# Patient Record
Sex: Male | Born: 1995 | Race: White | Hispanic: No | Marital: Single | Smoking: Never smoker
Health system: Southern US, Community
[De-identification: ages and names within clinical notes are randomized; demographics above are authoritative.]

## PROBLEM LIST (undated history)

## (undated) DIAGNOSIS — B2790 Infectious mononucleosis, unspecified without complication: Secondary | ICD-10-CM

## (undated) DIAGNOSIS — R768 Other specified abnormal immunological findings in serum: Secondary | ICD-10-CM

## (undated) HISTORY — DX: Other specified abnormal immunological findings in serum: R76.8

## (undated) HISTORY — DX: Infectious mononucleosis, unspecified without complication: B27.90

---
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
## 2017-07-12 DIAGNOSIS — K080 Exfoliation of teeth due to systemic causes: Secondary | ICD-10-CM | POA: Diagnosis not present

## 2017-12-04 ENCOUNTER — Encounter: Payer: Self-pay | Admitting: Internal Medicine

## 2018-01-26 DIAGNOSIS — J029 Acute pharyngitis, unspecified: Secondary | ICD-10-CM | POA: Diagnosis not present

## 2018-01-26 DIAGNOSIS — B2780 Other infectious mononucleosis without complication: Secondary | ICD-10-CM | POA: Diagnosis not present

## 2018-01-26 LAB — HEPATITIS C ANTIBODY: Hepatitis C Ab: POSITIVE

## 2018-01-26 LAB — BASIC METABOLIC PANEL
BUN: 9 (ref 4–21)
Creatinine: 0.9 (ref 0.6–1.3)
Glucose: 79
Potassium: 4.1 (ref 3.4–5.3)
Sodium: 138 (ref 137–147)

## 2018-01-26 LAB — CBC AND DIFFERENTIAL
HCT: 48 (ref 41–53)
Hemoglobin: 16.6 (ref 13.5–17.5)
Neutrophils Absolute: 1
Platelets: 186 (ref 150–399)
WBC: 9.5

## 2018-01-26 LAB — HEPATIC FUNCTION PANEL
ALT: 171 — AB (ref 10–40)
AST: 120 — AB (ref 14–40)
Alkaline Phosphatase: 112 (ref 25–125)
Bilirubin, Total: 0.7

## 2018-01-26 LAB — HEPATITIS B SURFACE ANTIGEN: Hepatitis B Surface Antigen: NEGATIVE

## 2018-01-26 LAB — MONONUCLEOSIS SCREEN: Mono Test: POSITIVE

## 2018-01-26 LAB — HEPATITIS A ANTIBODY, IGM: Hep A IgM: NEGATIVE

## 2018-01-26 LAB — HEPATITIS B CORE ANTIBODY, IGM: Hep B C IgM: NEGATIVE

## 2018-02-06 NOTE — Progress Notes (Signed)
Subjective:    Patient ID: Joshua Chen, male    DOB: 01-22-1996, 22 y.o.   MRN: [id number]  HPI:  Joshua Chen was dx'd with mononucleosis 01/26/18 at [facility] in [place]- he is a Consulting civil engineer at [employer]. He was also told his Hep C Virus Ab was + and his LFTs were markedly elevated. He denies tattoos, exposure to hypodermic needles. He has only had unprotected sexual contact with his current girlfriend (who is at [facility] and tested neg for Hep C) and his previous girlfriend. He asked "can my previous girlfriend been born with Hep C". We discussed that transmission rates from mother to baby are extremely low and the mother needed to have active Hep C at time of birth. He is unaware if his previous girlfriend's mother was ever Hep C + He was not given any medications at [facility] His only current sx's- swollen anterior cervical lymph nodes, fatigue, dull ache over RUQ He denies fever/night sweats/poor appetite/hematuria/hematochezia. He reports darker colored urine and has been increasing water intake. He denies ETOH or Acetaminophen use We dicussed the importance of staying well hydrated, eating a well balanced diet, getting adequate rest, avoiding contact sports, avoiding ETOH, and avoiding close contact with others, esp sexually contact. He denies hx of mononucleosis He is sophomore at [employer], currently studying applied math and plans to change major to computer science His girlfriend is at [facility] and he gave verbal permission for her to be present during OV.  Patient Care Team    Relationship Specialty Notifications Start End  Julaine Fusi, NP PCP - General Family Medicine  12/14/16     Patient Active Problem List   Diagnosis Date Noted  . Monospot test positive 02/07/2018  . Hepatitis C antibody test positive 02/07/2018  . Screening for HIV (human immunodeficiency virus) 02/07/2018  . Health care maintenance 12/14/2016     Past Medical History:  Diagnosis Date  . Hepatitis C  antibody positive in blood   . Mononucleosis      History reviewed. No pertinent surgical history.   Family History  Problem Relation Age of Onset  . Mental illness Mother   . Hypertension Father   . Mental illness Father   . Diabetes Father   . Hyperlipidemia Father   . Hypertension Maternal Grandfather   . Depression Maternal Grandfather   . Diabetes Maternal Grandfather   . Hyperlipidemia Maternal Grandfather   . Depression Paternal Uncle   . Suicidality Paternal Uncle   . Alcohol abuse Maternal Grandmother   . Diabetes Paternal Grandmother   . Hyperlipidemia Paternal Grandmother   . Diabetes Paternal Grandfather   . Hyperlipidemia Paternal Grandfather   . Hypertension Paternal Grandfather   . Depression Paternal Uncle   . Suicidality Paternal Uncle      Social History   Substance and Sexual Activity  Drug Use No     Social History   Substance and Sexual Activity  Alcohol Use No  . Alcohol/week: 0.0 oz     Social History   Tobacco Use  Smoking Status Never Smoker  Smokeless Tobacco Never Used     No outpatient encounter medications on file as of 02/07/2018.   No facility-administered encounter medications on file as of 02/07/2018.     Allergies: Patient has no known allergies.  Body mass index is 26.25 kg/m.  Blood pressure 118/73, pulse 84, height  (1.803 m), weight 188 lb 3.2 oz (85.4 kg), SpO2 98 %.  Review of Systems  Constitutional: Positive for fatigue. Negative for activity change, appetite change, chills, diaphoresis, fever and unexpected weight change.  Eyes: Negative for visual disturbance.  Respiratory: Negative for cough, chest tightness, shortness of breath, wheezing and stridor.   Cardiovascular: Negative for chest pain, palpitations and leg swelling.  Gastrointestinal: Positive for abdominal pain. Negative for abdominal distention, blood in stool, constipation, diarrhea, nausea and vomiting.  Endocrine: Negative for cold  intolerance, heat intolerance, polydipsia, polyphagia and polyuria.  Genitourinary: Negative for decreased urine volume, difficulty urinating, flank pain, frequency, hematuria, penile swelling and urgency.  Musculoskeletal: Negative for arthralgias, back pain, gait problem, joint swelling, myalgias, neck pain and neck stiffness.  Skin: Negative for color change, pallor, rash and wound.  Neurological: Negative for dizziness and headaches.  Hematological: Does not bruise/bleed easily.  Psychiatric/Behavioral: Negative for decreased concentration, dysphoric mood, hallucinations, self-injury, sleep disturbance and suicidal ideas. The patient is not nervous/anxious and is not hyperactive.        Objective:   Physical Exam  Constitutional: He appears well-developed and well-nourished. No distress.  HENT:  Head: Normocephalic and atraumatic.  Right Ear: External ear normal.  Left Ear: External ear normal.  Eyes: Pupils are equal, round, and reactive to light. Conjunctivae and EOM are normal.  Neck: Normal range of motion. Neck supple. No neck rigidity.    Bil anterior cervical lymphadenopathy  Cardiovascular: Normal rate, regular rhythm, normal heart sounds and intact distal pulses.  No murmur heard. Pulmonary/Chest: Effort normal and breath sounds normal. No stridor. No respiratory distress. He has no wheezes. He has no rales. He exhibits no tenderness.  Abdominal: Soft. Bowel sounds are normal. He exhibits no distension and no mass. There is hepatosplenomegaly. There is tenderness in the right upper quadrant. There is no rigidity, no rebound, no guarding, no CVA tenderness, no tenderness at McBurney's point and negative Murphy's sign. No hernia.  Lymphadenopathy:    He has cervical adenopathy.  Skin: Skin is warm and dry. Capillary refill takes less than 2 seconds. No rash noted. He is not diaphoretic. No erythema. No pallor.  Psychiatric: He has a normal mood and affect. His behavior is  normal. Judgment and thought content normal.      Assessment & Plan:   1. Screening for HIV (human immunodeficiency virus)   2. Hepatitis C antibody test positive   3. Monospot test positive     Screening for HIV (human immunodeficiency virus) We will cal when lab results are available  Monospot test positive Please follow Mononucleosis care instructions. We will call you when lab results are available. Urgent referral to Gastroenterologist. Recommend completed physical in 3 months. Please call with any questions/concerns.  Hepatitis C antibody test positive Urgent referral to Gastroenterologist.   FOLLOW-UP:  Return in about 3 months (around 05/10/2018) for CPE.

## 2018-02-07 ENCOUNTER — Encounter: Payer: Self-pay | Admitting: Adult Health

## 2018-02-07 ENCOUNTER — Ambulatory Visit (INDEPENDENT_AMBULATORY_CARE_PROVIDER_SITE_OTHER): Payer: Federal, State, Local not specified - PPO | Admitting: Adult Health

## 2018-02-07 VITALS — BP 118/73 | HR 84 | Ht 71.0 in | Wt 188.2 lb

## 2018-02-07 DIAGNOSIS — R768 Other specified abnormal immunological findings in serum: Secondary | ICD-10-CM | POA: Insufficient documentation

## 2018-02-07 DIAGNOSIS — Z114 Encounter for screening for human immunodeficiency virus [HIV]: Secondary | ICD-10-CM | POA: Insufficient documentation

## 2018-02-07 DIAGNOSIS — B2790 Infectious mononucleosis, unspecified without complication: Secondary | ICD-10-CM | POA: Diagnosis not present

## 2018-02-07 NOTE — Assessment & Plan Note (Signed)
Please follow Mononucleosis care instructions. We will call you when lab results are available. Urgent referral to Gastroenterologist. Recommend completed physical in 3 months. Please call with any questions/concerns.

## 2018-02-07 NOTE — Patient Instructions (Signed)
Infectious Mononucleosis Infectious mononucleosis is an infection that is caused by a virus. This illness is often called "mono." You can get mono from close contact with someone who is infected (it is contagious). If you have mono, you may feel tired and have a sore throat, a headache, or a fever. Mono is usually not serious, but some people may need to be treated for it in the hospital. Follow these instructions at home: Medicines  Take over-the-counter and prescription medicines only as told by your doctor.  Do not take ampicillin or amoxicillin. This may cause a rash.  If you are under 18, do not take aspirin. Activity  Rest as needed.  Do not do any of the following activities until your doctor says that they are safe for you: ? Contact sports. You may need to wait a month or longer before you play sports. ? Exercise that requires a lot of energy. ? Lifting heavy things.  Slowly go back to your normal activities after your fever is gone, or when your doctor says that you can. Be sure to rest when you get tired. Preventing infectious mononucleosis  Avoid contact with people who have mono. An infected person may not seem sick, but he or she can still spread the virus.  Avoid sharing forks, spoons, knives (utensils), drinking cups, or toothbrushes.  Wash your hands often with soap and water. If you cannot use soap and water, use hand sanitizer.  Use the inside of your elbow to cover your mouth when you cough or sneeze. General instructions  Avoid kissing or sharing forks, spoons, knives, or drinking cups until your doctor approves.  Drink enough fluid to keep your pee (urine) clear or pale yellow.  Do not drink alcohol.  If you have a sore throat: ? Rinse your mouth (gargle) with a salt-water mixture 3-4 times a day or as needed. To make a salt-water mixture, completely dissolve -1 tsp of salt in 1 cup of warm water. ? Eat soft foods. Cold foods such as ice cream or frozen  ice pops can help your throat feel better. ? Try sucking on hard candy.  Wash your hands often with soap and water. If you cannot use soap and water, use hand sanitizer. Contact a doctor if:  Your fever is not gone after 10 days.  You have swelling by your jaw or neck (swollen lymph nodes), and the swelling does not go away after 4 weeks.  Your activity level is not back to normal after 2 months.  Your skin or the white parts of your eyes turn yellow (jaundice).  You have trouble pooping (have constipation). This may mean that you: ? Poop (have a bowel movement) fewer times in a week than normal. ? Have a hard time pooping. ? Have poop that is dry, hard, or bigger than normal. Get help right away if:  You have very bad pain in your: ? Belly (abdomen). ? Shoulder.  You are drooling.  You have trouble swallowing.  You have trouble breathing.  You have a stiff neck.  You have a very bad headache.  You cannot stop throwing up (vomiting).  You have jerky movements that you cannot control (seizures).  You are confused.  You have trouble with balance.  Your nose or gums start to bleed.  You have signs of body fluid loss (dehydration). These may include: ? Weakness. ? Sunken eyes. ? Pale skin. ? Dry mouth. ? Fast breathing or heartbeat. Summary  Infectious mononucleosis, or "  mono," is an infection that is caused by a virus.  Mono is usually not serious, but some people may need to be treated for it in the hospital.  You should not play contact sports or lift heavy things until your doctor says that you can.  Wash your hands often with soap and water. If you cannot use soap and water, use hand sanitizer. This information is not intended to replace advice given to you by your health care provider. Make sure you discuss any questions you have with your health care provider. Document Released: 09/13/2009 Document Revised: 06/13/2016 Document Reviewed:  06/13/2016 Elsevier Interactive Patient Education  2017 [employer].  Please follow above care instructions. We will call you when lab results are available. Urgent referral to Gastroenterologist. Recommend completed physical in 3 months. Please call with any questions/concerns. FEEL BETTER!

## 2018-02-07 NOTE — Assessment & Plan Note (Signed)
We will cal when lab results are available

## 2018-02-07 NOTE — Assessment & Plan Note (Signed)
Urgent referral to Gastroenterologist.

## 2018-02-08 LAB — HIV ANTIBODY (ROUTINE TESTING W REFLEX): HIV Screen 4th Generation wRfx: NONREACTIVE

## 2018-02-12 ENCOUNTER — Telehealth: Payer: Self-pay

## 2018-02-12 NOTE — Telephone Encounter (Signed)
[name] called stating that [facility] informed her that they do not see pt's for Hepatitis C and that he should be referred to [facility].  Fonda said they could only see him for the elevated LFTs, but mom is assuming that this will resolve itself d/t recent mono diagnosis.  Please advise how you would like to proceed.  Tiajuana Amass, CMA

## 2018-02-19 ENCOUNTER — Other Ambulatory Visit: Payer: Self-pay | Admitting: Adult Health

## 2018-02-19 NOTE — Telephone Encounter (Signed)
LVM for pt's mother to call to discuss.  Tiajuana Amass, CMA

## 2018-02-19 NOTE — Telephone Encounter (Signed)
[name] informed of referral.  Advised pt's mother to call [name] in our office if she has not heard anything within 1 week.  Tiajuana Amass, CMA

## 2018-02-19 NOTE — Telephone Encounter (Signed)
Good Afternoon Team, Joshua Chen can you please call Joshua Chen and share: I am referring him to  [facility] [name] [phone] Per [facility] recommendations. If he or his mother has questions regarding mono sequelae, ie hepatitis, they should address them to the specialists.  [name]- can you please complete the referral, they are not listed in Epic  Thanks! Art Buff

## 2018-02-19 NOTE — Progress Notes (Signed)
Per [facility]- they will only treat LFTs and not address + Hep C. Advised to refer pt to [facility]- Ma Hillock [phone]

## 2018-02-27 DIAGNOSIS — B182 Chronic viral hepatitis C: Secondary | ICD-10-CM | POA: Diagnosis not present

## 2018-02-27 DIAGNOSIS — R768 Other specified abnormal immunological findings in serum: Secondary | ICD-10-CM | POA: Diagnosis not present

## 2018-02-27 LAB — HEPATIC FUNCTION PANEL
ALK PHOS: 70 (ref 25–125)
ALT: 36 (ref 10–40)
AST: 21 (ref 14–40)
BILIRUBIN, TOTAL: 1.9

## 2018-02-27 LAB — IRON,TIBC AND FERRITIN PANEL
Ferritin: 126
Iron: 123
TIBC: 326

## 2018-02-27 LAB — CBC AND DIFFERENTIAL
HCT: 45 (ref 41–53)
Hemoglobin: 15.7 (ref 13.5–17.5)
Platelets: 261 (ref 150–399)
WBC: 5.6

## 2018-02-27 LAB — PROTIME-INR: Protime: 11.2 (ref 10.0–13.8)

## 2018-02-27 LAB — HEPATITIS C ANTIBODY: Hepatitis C Ab: POSITIVE

## 2018-02-27 LAB — BASIC METABOLIC PANEL
CREATININE: 1 (ref <=1.3)
Sodium: 142 (ref 137–147)

## 2018-02-27 LAB — HEPATITIS B CORE ANTIBODY, TOTAL: Hep B S Ab: NEGATIVE

## 2018-03-01 DIAGNOSIS — K080 Exfoliation of teeth due to systemic causes: Secondary | ICD-10-CM | POA: Diagnosis not present

## 2018-04-12 ENCOUNTER — Encounter: Payer: Self-pay | Admitting: Adult Health

## 2018-04-18 ENCOUNTER — Encounter: Payer: Self-pay | Admitting: Adult Health

## 2018-04-18 ENCOUNTER — Ambulatory Visit (INDEPENDENT_AMBULATORY_CARE_PROVIDER_SITE_OTHER): Payer: Federal, State, Local not specified - PPO | Admitting: Adult Health

## 2018-04-18 ENCOUNTER — Other Ambulatory Visit: Payer: Self-pay

## 2018-04-18 VITALS — BP 117/74 | HR 65 | Ht 71.0 in | Wt 188.8 lb

## 2018-04-18 DIAGNOSIS — Z0000 Encounter for general adult medical examination without abnormal findings: Secondary | ICD-10-CM

## 2018-04-18 DIAGNOSIS — R768 Other specified abnormal immunological findings in serum: Secondary | ICD-10-CM

## 2018-04-18 DIAGNOSIS — R945 Abnormal results of liver function studies: Secondary | ICD-10-CM

## 2018-04-18 DIAGNOSIS — R7989 Other specified abnormal findings of blood chemistry: Secondary | ICD-10-CM

## 2018-04-18 NOTE — Assessment & Plan Note (Signed)
01/26/18- ALT 171, AST 120  CMP drawn today

## 2018-04-18 NOTE — Assessment & Plan Note (Signed)
Labs from [facility]- Liver function improved and no active HCV infection. Follow-up with Gastroenterologist in August 2019.

## 2018-04-18 NOTE — Addendum Note (Signed)
Addended by: Stan HeadNELSON, TONYA S on: 04/18/2018 01:51 PM   Modules accepted: Orders

## 2018-04-18 NOTE — Patient Instructions (Signed)
Preventive Care for Adults, Male A healthy lifestyle and preventive care can promote health and wellness. Preventive health guidelines for men include the following key practices:  A routine yearly physical is a good way to check with your health care provider about your health and preventative screening. It is a chance to share any concerns and updates on your health and to receive a thorough exam.  Visit your dentist for a routine exam and preventative care every 6 months. Brush your teeth twice a day and floss once a day. Good oral hygiene prevents tooth decay and gum disease.  The frequency of eye exams is based on your age, health, family medical history, use of contact lenses, and other factors. Follow your health care provider's recommendations for frequency of eye exams.  Eat a healthy diet. Foods such as vegetables, fruits, whole grains, low-fat dairy products, and lean protein foods contain the nutrients you need without too many calories. Decrease your intake of foods high in solid fats, added sugars, and salt. Eat the right amount of calories for you.Get information about a proper diet from your health care provider, if necessary.  Regular physical exercise is one of the most important things you can do for your health. Most adults should get at least 150 minutes of moderate-intensity exercise (any activity that increases your heart rate and causes you to sweat) each week. In addition, most adults need muscle-strengthening exercises on 2 or more days a week.  Maintain a healthy weight. The body mass index (BMI) is a screening tool to identify possible weight problems. It provides an estimate of body fat based on height and weight. Your health care provider can find your BMI and can help you achieve or maintain a healthy weight.For adults 20 years and older:  A BMI below 18.5 is considered underweight.  A BMI of 18.5 to 24.9 is normal.  A BMI of 25 to 29.9 is considered  overweight.  A BMI of 30 and above is considered obese.  Maintain normal blood lipids and cholesterol levels by exercising and minimizing your intake of saturated fat. Eat a balanced diet with plenty of fruit and vegetables. Blood tests for lipids and cholesterol should begin at age 55 and be repeated every 5 years. If your lipid or cholesterol levels are high, you are over 50, or you are at high risk for heart disease, you may need your cholesterol levels checked more frequently.Ongoing high lipid and cholesterol levels should be treated with medicines if diet and exercise are not working.  If you smoke, find out from your health care provider how to quit. If you do not use tobacco, do not start.  Lung cancer screening is recommended for adults aged 90-80 years who are at high risk for developing lung cancer because of a history of smoking. A yearly low-dose CT scan of the lungs is recommended for people who have at least a 30-pack-year history of smoking and are a current smoker or have quit within the past 15 years. A pack year of smoking is smoking an average of 1 pack of cigarettes a day for 1 year (for example: 1 pack a day for 30 years or 2 packs a day for 15 years). Yearly screening should continue until the smoker has stopped smoking for at least 15 years. Yearly screening should be stopped for people who develop a health problem that would prevent them from having lung cancer treatment.  If you choose to drink alcohol, do not have more  than 2 drinks per day. One drink is considered to be 12 ounces (355 mL) of beer, 5 ounces (148 mL) of wine, or 1.5 ounces (44 mL) of liquor.  Avoid use of street drugs. Do not share needles with anyone. Ask for help if you need support or instructions about stopping the use of drugs.  High blood pressure causes heart disease and increases the risk of stroke. Your blood pressure should be checked at least every 1-2 years. Ongoing high blood pressure should be  treated with medicines, if weight loss and exercise are not effective.  If you are 34-90 years old, ask your health care provider if you should take aspirin to prevent heart disease.  Diabetes screening is done by taking a blood sample to check your blood glucose level after you have not eaten for a certain period of time (fasting). If you are not overweight and you do not have risk factors for diabetes, you should be screened once every 3 years starting at age 35. If you are overweight or obese and you are 70-84 years of age, you should be screened for diabetes every year as part of your cardiovascular risk assessment.  Colorectal cancer can be detected and often prevented. Most routine colorectal cancer screening begins at the age of 18 and continues through age 69. However, your health care provider may recommend screening at an earlier age if you have risk factors for colon cancer. On a yearly basis, your health care provider may provide home test kits to check for hidden blood in the stool. Use of a small camera at the end of a tube to directly examine the colon (sigmoidoscopy or colonoscopy) can detect the earliest forms of colorectal cancer. Talk to your health care provider about this at age 71, when routine screening begins. Direct exam of the colon should be repeated every 5-10 years through age 18, unless early forms of precancerous polyps or small growths are found.  People who are at an increased risk for hepatitis B should be screened for this virus. You are considered at high risk for hepatitis B if:  You were born in a country where hepatitis B occurs often. Talk with your health care provider about which countries are considered high risk.  Your parents were born in a high-risk country and you have not received a shot to protect against hepatitis B (hepatitis B vaccine).  You have HIV or AIDS.  You use needles to inject street drugs.  You live with, or have sex with, someone who  has hepatitis B.  You are a man who has sex with other men (MSM).  You get hemodialysis treatment.  You take certain medicines for conditions such as cancer, organ transplantation, and autoimmune conditions.  Hepatitis C blood testing is recommended for all people born from [birth year] through 1965 and any individual with known risks for hepatitis C.  Practice safe sex. Use condoms and avoid high-risk sexual practices to reduce the spread of sexually transmitted infections (STIs). STIs include gonorrhea, chlamydia, syphilis, trichomonas, herpes, HPV, and human immunodeficiency virus (HIV). Herpes, HIV, and HPV are viral illnesses that have no cure. They can result in disability, cancer, and death.  If you are a man who has sex with other men, you should be screened at least once per year for:  HIV.  Urethral, rectal, and pharyngeal infection of gonorrhea, chlamydia, or both.  If you are at risk of being infected with HIV, it is recommended that you take a  prescription medicine daily to prevent HIV infection. This is called preexposure prophylaxis (PrEP). You are considered at risk if:  You are a man who has sex with other men (MSM) and have other risk factors.  You are a heterosexual man, are sexually active, and are at increased risk for HIV infection.  You take drugs by injection.  You are sexually active with a partner who has HIV.  Talk with your health care provider about whether you are at high risk of being infected with HIV. If you choose to begin PrEP, you should first be tested for HIV. You should then be tested every 3 months for as long as you are taking PrEP.  A one-time screening for abdominal aortic aneurysm (AAA) and surgical repair of large AAAs by ultrasound are recommended for men ages 44 to 66 years who are current or former smokers.  Healthy men should no longer receive prostate-specific antigen (PSA) blood tests as part of routine cancer screening. Talk with your health  care provider about prostate cancer screening.  Testicular cancer screening is not recommended for adult males who have no symptoms. Screening includes self-exam, a health care provider exam, and other screening tests. Consult with your health care provider about any symptoms you have or any concerns you have about testicular cancer.  Use sunscreen. Apply sunscreen liberally and repeatedly throughout the day. You should seek shade when your shadow is shorter than you. Protect yourself by wearing long sleeves, pants, a wide-brimmed hat, and sunglasses year round, whenever you are outdoors.  Once a month, do a whole-body skin exam, using a mirror to look at the skin on your back. Tell your health care provider about new moles, moles that have irregular borders, moles that are larger than a pencil eraser, or moles that have changed in shape or color.  Stay current with required vaccines (immunizations).  Influenza vaccine. All adults should be immunized every year.  Tetanus, diphtheria, and acellular pertussis (Td, Tdap) vaccine. An adult who has not previously received Tdap or who does not know his vaccine status should receive 1 dose of Tdap. This initial dose should be followed by tetanus and diphtheria toxoids (Td) booster doses every 10 years. Adults with an unknown or incomplete history of completing a 3-dose immunization series with Td-containing vaccines should begin or complete a primary immunization series including a Tdap dose. Adults should receive a Td booster every 10 years.  Varicella vaccine. An adult without evidence of immunity to varicella should receive 2 doses or a second dose if he has previously received 1 dose.  Human papillomavirus (HPV) vaccine. Males aged 11-21 years who have not received the vaccine previously should receive the 3-dose series. Males aged 22-26 years may be immunized. Immunization is recommended through the age of 23 years for any male who has sex with males  and did not get any or all doses earlier. Immunization is recommended for any person with an immunocompromised condition through the age of 72 years if he did not get any or all doses earlier. During the 3-dose series, the second dose should be obtained 4-8 weeks after the first dose. The third dose should be obtained 24 weeks after the first dose and 16 weeks after the second dose.  Zoster vaccine. One dose is recommended for adults aged 23 years or older unless certain conditions are present.  Measles, mumps, and rubella (MMR) vaccine. Adults born before [birth year] generally are considered immune to measles and mumps. Adults born in [birth year]  or later should have 1 or more doses of MMR vaccine unless there is a contraindication to the vaccine or there is laboratory evidence of immunity to each of the three diseases. A routine second dose of MMR vaccine should be obtained at least 28 days after the first dose for students attending postsecondary schools, health care workers, or international travelers. People who received inactivated measles vaccine or an unknown type of measles vaccine during 1963-1967 should receive 2 doses of MMR vaccine. People who received inactivated mumps vaccine or an unknown type of mumps vaccine before 1979 and are at high risk for mumps infection should consider immunization with 2 doses of MMR vaccine. Unvaccinated health care workers born before [birth year] who lack laboratory evidence of measles, mumps, or rubella immunity or laboratory confirmation of disease should consider measles and mumps immunization with 2 doses of MMR vaccine or rubella immunization with 1 dose of MMR vaccine.  Pneumococcal 13-valent conjugate (PCV13) vaccine. When indicated, a person who is uncertain of his immunization history and has no record of immunization should receive the PCV13 vaccine. All adults 9 years of age and older should receive this vaccine. An adult aged 69 years or older who has certain medical  conditions and has not been previously immunized should receive 1 dose of PCV13 vaccine. This PCV13 should be followed with a dose of pneumococcal polysaccharide (PPSV23) vaccine. Adults who are at high risk for pneumococcal disease should obtain the PPSV23 vaccine at least 8 weeks after the dose of PCV13 vaccine. Adults older than 20 years of age who have normal immune system function should obtain the PPSV23 vaccine dose at least 1 year after the dose of PCV13 vaccine.  Pneumococcal polysaccharide (PPSV23) vaccine. When PCV13 is also indicated, PCV13 should be obtained first. All adults aged 79 years and older should be immunized. An adult younger than age 43 years who has certain medical conditions should be immunized. Any person who resides in a nursing home or long-term care facility should be immunized. An adult smoker should be immunized. People with an immunocompromised condition and certain other conditions should receive both PCV13 and PPSV23 vaccines. People with human immunodeficiency virus (HIV) infection should be immunized as soon as possible after diagnosis. Immunization during chemotherapy or radiation therapy should be avoided. Routine use of PPSV23 vaccine is not recommended for American Indians, [place] Natives, or people younger than 65 years unless there are medical conditions that require PPSV23 vaccine. When indicated, people who have unknown immunization and have no record of immunization should receive PPSV23 vaccine. One-time revaccination 5 years after the first dose of PPSV23 is recommended for people aged 19-64 years who have chronic kidney failure, nephrotic syndrome, asplenia, or immunocompromised conditions. People who received 1-2 doses of PPSV23 before age 70 years should receive another dose of PPSV23 vaccine at age 79 years or later if at least 5 years have passed since the previous dose. Doses of PPSV23 are not needed for people immunized with PPSV23 at or after age 55  years.  Meningococcal vaccine. Adults with asplenia or persistent complement component deficiencies should receive 2 doses of quadrivalent meningococcal conjugate (MenACWY-D) vaccine. The doses should be obtained at least 2 months apart. Microbiologists working with certain meningococcal bacteria, [employer] recruits, people at risk during an outbreak, and people who travel to or live in countries with a high rate of meningitis should be immunized. A first-year college student up through age 64 years who is living in a residence hall should receive a  dose if he did not receive a dose on or after his 16th birthday. Adults who have certain high-risk conditions should receive one or more doses of vaccine.  Hepatitis A vaccine. Adults who wish to be protected from this disease, have chronic liver disease, work with hepatitis A-infected animals, work in hepatitis A research labs, or travel to or work in countries with a high rate of hepatitis A should be immunized. Adults who were previously unvaccinated and who anticipate close contact with an international adoptee during the first 60 days after arrival in the [place] from a country with a high rate of hepatitis A should be immunized.  Hepatitis B vaccine. Adults should be immunized if they wish to be protected from this disease, are under age 34 years and have diabetes, have chronic liver disease, have had more than one sex partner in the past 6 months, may be exposed to blood or other infectious body fluids, are household contacts or sex partners of hepatitis B positive people, are clients or workers in certain care facilities, or travel to or work in countries with a high rate of hepatitis B.  Haemophilus influenzae type b (Hib) vaccine. A previously unvaccinated person with asplenia or sickle cell disease or having a scheduled splenectomy should receive 1 dose of Hib vaccine. Regardless of previous immunization, a recipient of a hematopoietic stem cell  transplant should receive a 3-dose series 6-12 months after his successful transplant. Hib vaccine is not recommended for adults with HIV infection. Preventive Service / Frequency Ages 77 to 55  Blood pressure check.** / Every 3-5 years.  Lipid and cholesterol check.** / Every 5 years beginning at age 66.  Hepatitis C blood test.** / For any individual with known risks for hepatitis C.  Skin self-exam. / Monthly.  Influenza vaccine. / Every year.  Tetanus, diphtheria, and acellular pertussis (Tdap, Td) vaccine.** / Consult your health care provider. 1 dose of Td every 10 years.  Varicella vaccine.** / Consult your health care provider.  HPV vaccine. / 3 doses over 6 months, if 45 or younger.  Measles, mumps, rubella (MMR) vaccine.** / You need at least 1 dose of MMR if you were born in 1957 or later. You may also need a second dose.  Pneumococcal 13-valent conjugate (PCV13) vaccine.** / Consult your health care provider.  Pneumococcal polysaccharide (PPSV23) vaccine.** / 1 to 2 doses if you smoke cigarettes or if you have certain conditions.  Meningococcal vaccine.** / 1 dose if you are age 81 to 79 years and a Market researcher living in a residence hall, or have one of several medical conditions. You may also need additional booster doses.  Hepatitis A vaccine.** / Consult your health care provider.  Hepatitis B vaccine.** / Consult your health care provider.  Haemophilus influenzae type b (Hib) vaccine.** / Consult your health care provider. Ages 6 to 58  Blood pressure check.** / Every year.  Lipid and cholesterol check.** / Every 5 years beginning at age 89.  Lung cancer screening. / Every year if you are aged 84-80 years and have a 30-pack-year history of smoking and currently smoke or have quit within the past 15 years. Yearly screening is stopped once you have quit smoking for at least 15 years or develop a health problem that would prevent you from having  lung cancer treatment.  Fecal occult blood test (FOBT) of stool. / Every year beginning at age 90 and continuing until age 73. You may not have to do  this test if you get a colonoscopy every 10 years.  Flexible sigmoidoscopy** or colonoscopy.** / Every 5 years for a flexible sigmoidoscopy or every 10 years for a colonoscopy beginning at age 50 and continuing until age 75.  Hepatitis C blood test.** / For all people born from 1945 through 1965 and any individual with known risks for hepatitis C.  Skin self-exam. / Monthly.  Influenza vaccine. / Every year.  Tetanus, diphtheria, and acellular pertussis (Tdap/Td) vaccine.** / Consult your health care provider. 1 dose of Td every 10 years.  Varicella vaccine.** / Consult your health care provider.  Zoster vaccine.** / 1 dose for adults aged 60 years or older.  Measles, mumps, rubella (MMR) vaccine.** / You need at least 1 dose of MMR if you were born in 1957 or later. You may also need a second dose.  Pneumococcal 13-valent conjugate (PCV13) vaccine.** / Consult your health care provider.  Pneumococcal polysaccharide (PPSV23) vaccine.** / 1 to 2 doses if you smoke cigarettes or if you have certain conditions.  Meningococcal vaccine.** / Consult your health care provider.  Hepatitis A vaccine.** / Consult your health care provider.  Hepatitis B vaccine.** / Consult your health care provider.  Haemophilus influenzae type b (Hib) vaccine.** / Consult your health care provider. Ages 65 and over  Blood pressure check.** / Every year.  Lipid and cholesterol check.**/ Every 5 years beginning at age 20.  Lung cancer screening. / Every year if you are aged 55-80 years and have a 30-pack-year history of smoking and currently smoke or have quit within the past 15 years. Yearly screening is stopped once you have quit smoking for at least 15 years or develop a health problem that would prevent you from having lung cancer treatment.  Fecal  occult blood test (FOBT) of stool. / Every year beginning at age 50 and continuing until age 75. You may not have to do this test if you get a colonoscopy every 10 years.  Flexible sigmoidoscopy** or colonoscopy.** / Every 5 years for a flexible sigmoidoscopy or every 10 years for a colonoscopy beginning at age 50 and continuing until age 75.  Hepatitis C blood test.** / For all people born from 1945 through 1965 and any individual with known risks for hepatitis C.  Abdominal aortic aneurysm (AAA) screening.** / A one-time screening for ages 65 to 75 years who are current or former smokers.  Skin self-exam. / Monthly.  Influenza vaccine. / Every year.  Tetanus, diphtheria, and acellular pertussis (Tdap/Td) vaccine.** / 1 dose of Td every 10 years.  Varicella vaccine.** / Consult your health care provider.  Zoster vaccine.** / 1 dose for adults aged 60 years or older.  Pneumococcal 13-valent conjugate (PCV13) vaccine.** / 1 dose for all adults aged 65 years and older.  Pneumococcal polysaccharide (PPSV23) vaccine.** / 1 dose for all adults aged 65 years and older.  Meningococcal vaccine.** / Consult your health care provider.  Hepatitis A vaccine.** / Consult your health care provider.  Hepatitis B vaccine.** / Consult your health care provider.  Haemophilus influenzae type b (Hib) vaccine.** / Consult your health care provider. **Family history and personal history of risk and conditions may change your health care provider's recommendations.   This information is not intended to replace advice given to you by your health care provider. Make sure you discuss any questions you have with your health care provider.   Document Released: 11/21/2001 Document Revised: 10/16/2014 Document Reviewed: 02/20/2011 Elsevier Interactive Patient Education 2016   [place] refers to food and lifestyle choices that are based on the traditions of countries  located on the [employer]. This way of eating has been shown to help prevent certain conditions and improve outcomes for people who have chronic diseases, like kidney disease and heart disease. What are tips for following this plan? Lifestyle  Cook and eat meals together with your family, when possible.  Drink enough fluid to keep your urine clear or pale yellow.  Be physically active every day. This includes: ? Aerobic exercise like running or swimming. ? Leisure activities like gardening, walking, or housework.  Get 7-8 hours of sleep each night.  If recommended by your health care provider, drink red wine in moderation. This means 1 glass a day for nonpregnant women and 2 glasses a day for men. A glass of wine equals 5 oz (150 mL). Reading food labels  Check the serving size of packaged foods. For foods such as rice and pasta, the serving size refers to the amount of cooked product, not dry.  Check the total fat in packaged foods. Avoid foods that have saturated fat or trans fats.  Check the ingredients list for added sugars, such as corn syrup. Shopping  At the grocery store, buy most of your food from the areas near the walls of the store. This includes: ? Fresh fruits and vegetables (produce). ? Grains, beans, nuts, and seeds. Some of these may be available in unpackaged forms or large amounts (in bulk). ? Fresh seafood. ? Poultry and eggs. ? Low-fat dairy products.  Buy whole ingredients instead of prepackaged foods.  Buy fresh fruits and vegetables in-season from local farmers markets.  Buy frozen fruits and vegetables in resealable bags.  If you do not have access to quality fresh seafood, buy precooked frozen shrimp or canned fish, such as tuna, salmon, or sardines.  Buy small amounts of raw or cooked vegetables, salads, or olives from the deli or salad bar at your store.  Stock your pantry so you always have certain foods on hand, such as olive oil, canned  tuna, canned tomatoes, rice, pasta, and beans. Cooking  Cook foods with extra-virgin olive oil instead of using butter or other vegetable oils.  Have meat as a side dish, and have vegetables or grains as your main dish. This means having meat in small portions or adding small amounts of meat to foods like pasta or stew.  Use beans or vegetables instead of meat in common dishes like chili or lasagna.  Experiment with different cooking methods. Try roasting or broiling vegetables instead of steaming or sauteing them.  Add frozen vegetables to soups, stews, pasta, or rice.  Add nuts or seeds for added healthy fat at each meal. You can add these to yogurt, salads, or vegetable dishes.  Marinate fish or vegetables using olive oil, lemon juice, garlic, and fresh herbs. Meal planning  Plan to eat 1 vegetarian meal one day each week. Try to work up to 2 vegetarian meals, if possible.  Eat seafood 2 or more times a week.  Have healthy snacks readily available, such as: ? Vegetable sticks with hummus. ? [place] yogurt. ? Fruit and nut trail mix.  Eat balanced meals throughout the week. This includes: ? Fruit: 2-3 servings a day ? Vegetables: 4-5 servings a day ? Low-fat dairy: 2 servings a day ? Fish, poultry, or lean meat: 1 serving a day ? Beans and legumes: 2 or more servings a  week ? Nuts and seeds: 1-2 servings a day ? Whole grains: 6-8 servings a day ? Extra-virgin olive oil: 3-4 servings a day  Limit red meat and sweets to only a few servings a month What are my food choices?  Mediterranean diet ? Recommended ? Grains: Whole-grain pasta. Brown rice. Bulgar wheat. Polenta. Couscous. Whole-wheat bread. [place]. ? Vegetables: Artichokes. Beets. Broccoli. Cabbage. Carrots. Eggplant. Green beans. Chard. Kale. Spinach. Onions. Leeks. Peas. Squash. Tomatoes. Peppers. Radishes. ? Fruits: Apples. Apricots. Avocado. Berries. Bananas. Cherries. Dates. Figs. Grapes. Lemons. Melon.  Oranges. Peaches. Plums. Pomegranate. ? Meats and other protein foods: Beans. Almonds. Sunflower seeds. Pine nuts. Peanuts. [place]. Salmon. Scallops. Shrimp. [place]. Tilapia. Clams. Oysters. Eggs. ? Dairy: Low-fat milk. Cheese. Greek yogurt. ? Beverages: Water. Red wine. Herbal tea. ? Fats and oils: Extra virgin olive oil. Avocado oil. Grape seed oil. ? Sweets and desserts: [place] yogurt with honey. Baked apples. Poached pears. Trail mix. ? Seasoning and other foods: Basil. Cilantro. Coriander. Cumin. Mint. Parsley. Sage. Rosemary. Tarragon. Garlic. Oregano. Thyme. Pepper. Balsalmic vinegar. Tahini. Hummus. Tomato sauce. Olives. Mushrooms. ? Limit these ? Grains: Prepackaged pasta or rice dishes. Prepackaged cereal with added sugar. ? Vegetables: Deep fried potatoes (french fries). ? Fruits: Fruit canned in syrup. ? Meats and other protein foods: Beef. Pork. Lamb. Poultry with skin. Hot dogs. Berniece Salines. ? Dairy: Ice cream. Sour cream. Whole milk. ? Beverages: Juice. Sugar-sweetened soft drinks. Beer. Liquor and spirits. ? Fats and oils: Butter. Canola oil. Vegetable oil. Beef fat (tallow). Lard. ? Sweets and desserts: Cookies. Cakes. Pies. Candy. ? Seasoning and other foods: Mayonnaise. Premade sauces and marinades. ? The items listed may not be a complete list. Talk with your dietitian about what dietary choices are right for you. Summary  The Mediterranean diet includes both food and lifestyle choices.  Eat a variety of fresh fruits and vegetables, beans, nuts, seeds, and whole grains.  Limit the amount of red meat and sweets that you eat.  Talk with your health care provider about whether it is safe for you to drink red wine in moderation. This means 1 glass a day for nonpregnant women and 2 glasses a day for men. A glass of wine equals 5 oz (150 mL). This information is not intended to replace advice given to you by your health care provider. Make sure you discuss any questions you have with  your health care provider. Document Released: 05/18/2016 Document Revised: 06/20/2016 Document Reviewed: 05/18/2016 Elsevier Interactive Patient Education  2018 [employer].   Increase water intake, strive for at least 90 ounces/day.   Follow Mediterrenean diet. Increase regular exercise.  Recommend at least 30 minutes daily, 5 days per week of walking, jogging, biking, swimming, YouTube/Pinterest workout videos. Labs from [facility]- Liver function improved no active HCV infection. Please keep follow-up with Gastroenterologist in August 2019. Follow-up with annual physical. NICE TO SEE YOU!

## 2018-04-18 NOTE — Assessment & Plan Note (Signed)
Increase water intake, strive for at least 90 ounces/day. Follow Mediterrenean diet. Increase regular exercise.  Recommend at least 30 minutes daily, 5 days per week of walking, jogging, biking, swimming, YouTube/Pinterest workout videos. Follow-up with annual physical.

## 2018-04-18 NOTE — Progress Notes (Signed)
Subjective:    Patient ID: Joshua LusterRyan S Chen, male    DOB: 12/01/1995, 22 y.o.   MRN: [id number]  HPI:  Joshua Chen is here for CPE He was [facility] with [facility] [place]- he is a Consulting civil engineerstudent at [employer] He was also told his Hep C Virus Ab was + and his LFTs were markedly elevated. [facility]- LFTs WNL and HCV RNA, Quantitative Real Time PCR- not detected, therefore no active HCV infection- he has f/u August 2019. He reports sig reduction in fatigue, denies fever/night sweats/HA/dizziness/palpitations. He denies abdominal pain or change in bowel habits. He has been increasing water intake, estimates to drink >60 oz/day He has been reducing saturated fat intake. He denies regular exercise, however enjoys running and plans on resuming regime this summer. He continues to abstain from tobacco/ETOH use Reviewed [facility]  Healthcare Maintenance: Immunizations-UTD  Patient Care Team    Relationship Specialty Notifications Start End  Julaine Fusianford, Tanis Hensarling D, NP PCP - General Family Medicine  12/14/16     Patient Active Problem List   Diagnosis Date Noted  . Elevated liver function tests 04/18/2018  . Monospot test positive 02/07/2018  . Hepatitis C antibody test positive 02/07/2018  . Screening for HIV (human immunodeficiency virus) 02/07/2018  . Health care maintenance 12/14/2016     Past Medical History:  Diagnosis Date  . Hepatitis C antibody positive in blood   . Mononucleosis      History reviewed. No pertinent surgical history.   Family History  Problem Relation Age of Onset  . Mental illness Mother   . Hypertension Father   . Mental illness Father   . Diabetes Father   . Hyperlipidemia Father   . Hypertension Maternal Grandfather   . Depression Maternal Grandfather   . Diabetes Maternal Grandfather   . Hyperlipidemia Maternal Grandfather   .  Depression Paternal Uncle   . Suicidality Paternal Uncle   . Alcohol abuse Maternal Grandmother   . Diabetes Paternal Grandmother   . Hyperlipidemia Paternal Grandmother   . Diabetes Paternal Grandfather   . Hyperlipidemia Paternal Grandfather   . Hypertension Paternal Grandfather   . Depression Paternal Uncle   . Suicidality Paternal Uncle      Social History   Substance and Sexual Activity  Drug Use No     Social History   Substance and Sexual Activity  Alcohol Use No  . Alcohol/week: 0.0 oz     Social History   Tobacco Use  Smoking Status Never Smoker  Smokeless Tobacco Never Used     No outpatient encounter medications on file as of 04/18/2018.   No facility-administered encounter medications on file as of 04/18/2018.     Allergies: Patient has no known allergies.  Body mass index is 26.33 kg/m.  Blood pressure 117/74, pulse 65, height 5\' 11"  (1.803 m), weight 188 lb 12.8 oz (85.6 kg), SpO2 100 %.     Review of Systems  Constitutional: Negative for activity change, appetite change, chills, diaphoresis, fatigue, fever and unexpected weight change.  HENT: Negative for congestion.   Eyes: Negative for visual disturbance.  Respiratory: Positive for cough. Negative for chest tightness, shortness of breath, wheezing and stridor.   Cardiovascular: Negative for chest pain, palpitations and leg swelling.  Gastrointestinal: Negative for abdominal distention, abdominal pain, blood in stool, constipation, diarrhea, nausea and vomiting.  Endocrine: Negative  for cold intolerance, heat intolerance, polydipsia, polyphagia and polyuria.  Genitourinary: Negative for difficulty urinating, flank pain, frequency and hematuria.  Musculoskeletal: Negative for arthralgias, back pain, gait problem, joint swelling, myalgias, neck pain and neck stiffness.  Skin: Negative for color change, pallor, rash and wound.  Neurological: Negative for dizziness and headaches.   Hematological: Does not bruise/bleed easily.  Psychiatric/Behavioral: Negative for confusion, decreased concentration, dysphoric mood, hallucinations, self-injury, sleep disturbance and suicidal ideas. The patient is not nervous/anxious and is not hyperactive.        Objective:   Physical Exam  Constitutional: He is oriented to person, place, and time. He appears well-developed and well-nourished. No distress.  HENT:  Head: Normocephalic and atraumatic.  Right Ear: External ear normal. Tympanic membrane is not perforated and not bulging. No decreased hearing is noted.  Left Ear: External ear normal. Tympanic membrane is not perforated and not bulging. No decreased hearing is noted.  Nose: Nose normal. No mucosal edema. Right sinus exhibits no maxillary sinus tenderness and no frontal sinus tenderness. Left sinus exhibits no maxillary sinus tenderness and no frontal sinus tenderness.  Mouth/Throat: Uvula is midline, oropharynx is clear and moist and mucous membranes are normal.  Eyes: Pupils are equal, round, and reactive to light. Conjunctivae and EOM are normal.  Neck: Normal range of motion. Neck supple.  Cardiovascular: Normal rate, regular rhythm, normal heart sounds and intact distal pulses.  No murmur heard. Pulmonary/Chest: Effort normal and breath sounds normal. No stridor. No respiratory distress. He has no wheezes. He has no rales. He exhibits no tenderness.  Abdominal: Soft. Bowel sounds are normal. He exhibits no distension and no mass. There is no hepatosplenomegaly. There is no tenderness. There is no rigidity, no rebound, no guarding and no CVA tenderness. No hernia.  Musculoskeletal: Normal range of motion.  Lymphadenopathy:    He has no cervical adenopathy.  Neurological: He is alert and oriented to person, place, and time. Coordination normal.  Skin: Skin is warm and dry. Capillary refill takes less than 2 seconds. No rash noted. He is not diaphoretic. No erythema. No  pallor.  Psychiatric: His behavior is normal. Judgment and thought content normal. His affect is blunt.  Nursing note and vitals reviewed.     Assessment & Plan:   1. Health care maintenance   2. Hepatitis C antibody test positive   3. Elevated liver function tests     Health care maintenance Increase water intake, strive for at least 90 ounces/day. Follow Mediterrenean diet. Increase regular exercise.  Recommend at least 30 minutes daily, 5 days per week of walking, jogging, biking, swimming, YouTube/Pinterest workout videos. Follow-up with annual physical.  Hepatitis C antibody test positive Labs [facility]- Liver function improved and no active HCV infection. Follow-up with Gastroenterologist in August 2019.  Elevated liver function tests 01/26/18- ALT 171, AST 120  CMP drawn today    FOLLOW-UP:  Return in about 1 year (around 04/19/2019) for CPE.

## 2018-04-19 LAB — COMPREHENSIVE METABOLIC PANEL
A/G RATIO: 1.9 (ref 1.2–2.2)
ALBUMIN: 5 g/dL (ref 3.5–5.5)
ALT: 20 IU/L (ref 0–44)
AST: 18 IU/L (ref 0–40)
Alkaline Phosphatase: 58 IU/L (ref 39–117)
BILIRUBIN TOTAL: 1.3 mg/dL — AB (ref 0.0–1.2)
BUN / CREAT RATIO: 10 (ref 9–20)
BUN: 9 mg/dL (ref 6–20)
CHLORIDE: 100 mmol/L (ref 96–106)
CO2: 24 mmol/L (ref 20–29)
Calcium: 10.3 mg/dL — ABNORMAL HIGH (ref 8.7–10.2)
Creatinine, Ser: 0.88 mg/dL (ref 0.76–1.27)
GFR calc non Af Amer: 124 mL/min/{1.73_m2} (ref >59)
GFR, EST AFRICAN AMERICAN: 143 mL/min/{1.73_m2} (ref >59)
GLOBULIN, TOTAL: 2.6 g/dL (ref 1.5–4.5)
Glucose: 83 mg/dL (ref 65–99)
POTASSIUM: 4.6 mmol/L (ref 3.5–5.2)
SODIUM: 141 mmol/L (ref 134–144)
TOTAL PROTEIN: 7.6 g/dL (ref 6.0–8.5)

## 2018-05-23 ENCOUNTER — Encounter: Payer: Federal, State, Local not specified - PPO | Admitting: Adult Health

## 2018-05-23 ENCOUNTER — Encounter: Payer: Self-pay | Admitting: Family Medicine

## 2018-05-23 ENCOUNTER — Ambulatory Visit (INDEPENDENT_AMBULATORY_CARE_PROVIDER_SITE_OTHER): Payer: Federal, State, Local not specified - PPO | Admitting: Family Medicine

## 2018-05-23 VITALS — BP 125/81 | HR 97 | Temp 98.4°F | Ht 71.0 in | Wt 182.9 lb

## 2018-05-23 DIAGNOSIS — J029 Acute pharyngitis, unspecified: Secondary | ICD-10-CM | POA: Diagnosis not present

## 2018-05-23 DIAGNOSIS — R110 Nausea: Secondary | ICD-10-CM | POA: Diagnosis not present

## 2018-05-23 DIAGNOSIS — J069 Acute upper respiratory infection, unspecified: Secondary | ICD-10-CM | POA: Diagnosis not present

## 2018-05-23 MED ORDER — PREDNISONE 20 MG PO TABS: ORAL_TABLET | ORAL | 0 refills | Status: DC

## 2018-05-23 MED ORDER — ONDANSETRON 8 MG PO TBDP: 8.0000 mg | ORAL_TABLET | Freq: Three times a day (TID) | ORAL | 0 refills | Status: DC | PRN

## 2018-05-23 NOTE — Patient Instructions (Signed)
You can use over-the-counter afrin nasal spray for up to 3 days (NO longer than that) which will help acutely with nasal drainage/ congestion short term.     Also, sterile saline nasal rinses, such as Lloyd HugerNeil med or AYR sinus rinses, can be very helpful and should be done twice daily- especially throughout the allergy season.   Remember you should use distilled water or previously boiled water to do this.  Then you may use over-the-counter Flonase 1 spray each nostril twice daily after sinus rinses.  You can do this in addition to taking any Allegra or Claritin or Zyrtec etc. that you may be taking daily.

## 2018-05-23 NOTE — Progress Notes (Signed)
Pt here for an acute care OV today   Impression and Recommendations:    1. Viral upper respiratory tract infection   2. Nausea   3. Acute pharyngitis, unspecified etiology- due to PNDrip     1. URI  - Viral vs Allergic vs Bacterial causes for pt's symptoms reveiwed.    - Supportive care and various OTC medications discussed in addition to any prescribed. - Call or RTC if new symptoms, or if no improvement or worse over next several days.   - Will consider ABX if sx continue past 10 days and worsening if not already given.   -Discussed with the patient and his mother today that we can proceed to using prescription Tussionex to aid with the patient's cough, to which he declined today.    -Patient will be given Rx Prednisone to aid with inflammation for the patient to begin today  -Advised the patient to began using AYR or Neilmed sinus rinses BID followed by flonase BID (one spray to each nostril). Advised that the patient may also incorporate allegra or claritin PRN.   -Discussed that if the patients symptoms worsen, to return to the office for further evaluation.   Meds ordered this encounter  Medications  . DISCONTD: predniSONE (DELTASONE) 20 MG tablet    Sig: Take 3 pills a day for 2 days, 2 pills a day for 2 days, 1 pill a day for 2 days then one half pill a day for 2 days then off    Dispense:  14 tablet    Refill:  0  . DISCONTD: ondansetron (ZOFRAN-ODT) 8 MG disintegrating tablet    Sig: Take 1 tablet (8 mg total) by mouth every 8 (eight) hours as needed for refractory nausea / vomiting.    Dispense:  20 tablet    Refill:  0    Education and routine counseling performed. Handouts provided  Gross side effects, risk and benefits, and alternatives of medications and treatment plan in general discussed with patient.  Patient is aware that all medications have potential side effects and we are unable to predict every side effect or drug-drug interaction that may occur.    Patient will call with any questions prior to using medication if they have concerns.  Expresses verbal understanding and consents to current therapy and treatment regimen.  No barriers to understanding were identified.  Red flag symptoms and signs discussed in detail.  Patient expressed understanding regarding what to do in case of emergency\urgent symptoms   Please see AVS handed out to patient at the end of our visit for further patient instructions/ counseling done pertaining to today's office visit.   Return if symptoms worsen or fail to improve, for f/up katy your PCP for chronic care and concerns.     Note:  This document was prepared occasionally using Dragon voice recognition software and may include unintentional dictation errors in addition to a scribe.  This document serves as a record of services personally performed by Thomasene Loteborah Marcy Sookdeo, DO. It was created on her behalf by Chestine SporeSoijett Blue, a trained medical scribe. The creation of this record is based on the scribe's personal observations and the provider's statements to them.   I have reviewed the above medical documentation for accuracy and completeness and I concur.   Thomasene LotDeborah Jette Lewan 05/23/18 5:32 PM  --------------------------------------------------------------------------------------------------------------------------------------------------------------------------------------------------------------------------------------------    Subjective:    CC:  Chief Complaint  Patient presents with  . Cough    HPI: Joshua Chen is a  22 y.o. male who [facility] for issues as discussed below.  He is complaining of worsening sinus pressure onset 2-3 days ago. He is having associated symptoms of worsening cough x 1 month, sore throat, lack of taste x 2 weeks, fever, nasal congestion, chills, myalgias, nausea, and lack of appetite. His cough is sporadic without a pattern.  He  states that he has tried Motrin, sudafed, and phenergan this morning with no relief for his symptoms. [place]. He denies recent seasonal allergies. He denies ear pain, facial pain, wheezing, SOB, and any other symptoms.   No problems updated.   Wt Readings from Last 3 Encounters:  06/29/18 182 lb (82.6 kg)  05/23/18 182 lb 14.4 oz (83 kg)  04/18/18 188 lb 12.8 oz (85.6 kg)   BP Readings from Last 3 Encounters:  06/29/18 140/90  05/23/18 125/81  04/18/18 117/74   BMI Readings from Last 3 Encounters:  06/29/18 25.38 kg/m  05/23/18 25.51 kg/m  04/18/18 26.33 kg/m     Patient Care Team    Relationship Specialty Notifications Start End  Julaine Fusianford, Katy D, NP PCP - General Family Medicine  12/14/16      Patient Active Problem List   Diagnosis Date Noted  . Elevated liver function tests 04/18/2018  . Monospot test positive 02/07/2018  . Hepatitis C antibody test positive 02/07/2018  . Screening for HIV (human immunodeficiency virus) 02/07/2018  . Health care maintenance 12/14/2016    Past Medical history, Surgical history, Family history, Social history, Allergies and Medications have been entered into the medical record, reviewed and changed as needed.    Current Meds  Medication Sig  . [DISCONTINUED] ibuprofen (ADVIL,MOTRIN) 200 MG tablet Take 200 mg by mouth every 6 (six) hours as needed.  . [DISCONTINUED] Promethazine HCl (PHENERGAN PO) Take 1 tablet by mouth.    Allergies:  No Known Allergies   Review of Systems: - see above HPI for pertinent positives General:   No F/C, wt loss Pulm:   No DIB, pleuritic chest pain Card:  No CP, palpitations Abd:  No n/v/d or pain Ext:  No inc edema from baseline   Objective:   BP 125/81   Pulse 97   Temp 98.4 F (36.9 C)   Ht 5\' 11"  (1.803 m)   Wt 182 lb 14.4 oz (83 kg)   SpO2 97%   BMI 25.51 kg/m  Body mass index is 25.51 kg/m. General: Well Developed, well nourished, appropriate  for stated age.  Neuro: Alert and oriented x3, extra-ocular muscles intact, sensation grossly intact.  HEENT: Normocephalic, atraumatic, pupils equal round reactive to light, neck supple, no masses, no painful lymphadenopathy, TM's intact B/L, no acute findings. Nares- patent, clear d/c, OP- clear, mild erythema, No TTP sinuses Skin: Warm and dry, no gross rash. Cardiac: RRR, S1 S2,  no murmurs rubs or gallops.  Respiratory: ECTA B/L and A/P, Not using accessory muscles, speaking in full sentences- unlabored. Vascular:  No gross lower ext edema, cap RF less 2 sec. Psych: No HI/SI, judgement and insight good, Euthymic mood. Full Affect.

## 2018-05-27 ENCOUNTER — Telehealth: Payer: Self-pay | Admitting: Adult Health

## 2018-05-27 MED ORDER — AMOXICILLIN-POT CLAVULANATE 875-125 MG PO TABS: 1.0000 | ORAL_TABLET | Freq: Two times a day (BID) | ORAL | 0 refills | Status: DC

## 2018-05-27 NOTE — Telephone Encounter (Signed)
[name] informed.  Tiajuana Amass. Nelson, CMA

## 2018-05-27 NOTE — Telephone Encounter (Signed)
Pt's mom called states he is still having symptoms still aggrevating patient ([facility] parent request nurse call her so stronger Rx can be sent to pharmacy [place].--- Pt was treated by Dr. Sharee Holsterpalski while Orpha BurKaty was out of office last week.  ---forwarding message to medical assistant

## 2018-05-27 NOTE — Telephone Encounter (Signed)
Good Afternoon Tonya, Can you please call Mrs. Starr and share- I sent in Augmentin 875mg  BID x 10 days [facility] Thanks! Orpha BurKaty

## 2018-05-27 NOTE — Telephone Encounter (Signed)
Seen by Dr. Sharee Holsterpalski last week for URI and only treated with prednisone.  Sinuses have "opened up" but he is having a lot of thick greenish/yellow drainage with cough.  No fever.  Sinus pressure around eyes and nose.  [name] is requesting RX for ABX since pt is back at college.  Please advise.  Joshua Chen, CMA

## 2018-06-11 DIAGNOSIS — R3129 Other microscopic hematuria: Secondary | ICD-10-CM | POA: Diagnosis not present

## 2018-06-11 DIAGNOSIS — M545 Low back pain: Secondary | ICD-10-CM | POA: Diagnosis not present

## 2018-06-14 DIAGNOSIS — R109 Unspecified abdominal pain: Secondary | ICD-10-CM | POA: Diagnosis not present

## 2018-06-14 DIAGNOSIS — N132 Hydronephrosis with renal and ureteral calculous obstruction: Secondary | ICD-10-CM | POA: Diagnosis not present

## 2018-06-14 DIAGNOSIS — N200 Calculus of kidney: Secondary | ICD-10-CM | POA: Diagnosis not present

## 2018-06-14 DIAGNOSIS — R110 Nausea: Secondary | ICD-10-CM | POA: Diagnosis not present

## 2018-06-14 DIAGNOSIS — R319 Hematuria, unspecified: Secondary | ICD-10-CM | POA: Diagnosis not present

## 2018-06-14 DIAGNOSIS — N1330 Unspecified hydronephrosis: Secondary | ICD-10-CM | POA: Diagnosis not present

## 2018-06-27 DIAGNOSIS — R109 Unspecified abdominal pain: Secondary | ICD-10-CM | POA: Diagnosis not present

## 2018-06-27 DIAGNOSIS — N1339 Other hydronephrosis: Secondary | ICD-10-CM | POA: Diagnosis not present

## 2018-06-27 DIAGNOSIS — R3129 Other microscopic hematuria: Secondary | ICD-10-CM | POA: Diagnosis not present

## 2018-06-27 DIAGNOSIS — N200 Calculus of kidney: Secondary | ICD-10-CM | POA: Diagnosis not present

## 2018-06-29 ENCOUNTER — Encounter (HOSPITAL_COMMUNITY): Payer: Self-pay | Admitting: *Deleted

## 2018-06-29 ENCOUNTER — Emergency Department (HOSPITAL_COMMUNITY)
Admission: EM | Admit: 2018-06-29 | Discharge: 2018-06-29 | Disposition: A | Discharge status: Home / Self Care | Payer: Federal, State, Local not specified - PPO | Attending: Emergency Medicine | Admitting: Emergency Medicine

## 2018-06-29 ENCOUNTER — Emergency Department (HOSPITAL_COMMUNITY): Payer: Federal, State, Local not specified - PPO

## 2018-06-29 DIAGNOSIS — Z79899 Other long term (current) drug therapy: Secondary | ICD-10-CM | POA: Diagnosis not present

## 2018-06-29 DIAGNOSIS — N201 Calculus of ureter: Secondary | ICD-10-CM

## 2018-06-29 DIAGNOSIS — R1032 Left lower quadrant pain: Secondary | ICD-10-CM | POA: Diagnosis not present

## 2018-06-29 DIAGNOSIS — N202 Calculus of kidney with calculus of ureter: Secondary | ICD-10-CM | POA: Diagnosis not present

## 2018-06-29 LAB — CBC WITH DIFFERENTIAL/PLATELET
BASOS ABS: 0 10*3/uL (ref 0.0–0.1)
Basophils Relative: 0 %
Eosinophils Absolute: 0 10*3/uL (ref 0.0–0.7)
Eosinophils Relative: 0 %
HCT: 40.7 % (ref 39.0–52.0)
HEMOGLOBIN: 14.5 g/dL (ref 13.0–17.0)
Lymphocytes Relative: 5 %
Lymphs Abs: 0.8 10*3/uL (ref 0.7–4.0)
MCH: 30.6 pg (ref 26.0–34.0)
MCHC: 35.6 g/dL (ref 30.0–36.0)
MCV: 85.9 fL (ref 78.0–100.0)
Monocytes Absolute: 0.8 10*3/uL (ref 0.1–1.0)
Monocytes Relative: 5 %
NEUTROS PCT: 90 %
Neutro Abs: 13.8 10*3/uL — ABNORMAL HIGH (ref 1.7–7.7)
Platelets: 294 10*3/uL (ref 150–400)
RBC: 4.74 MIL/uL (ref 4.22–5.81)
RDW: 12.6 % (ref 11.5–15.5)
WBC: 15.4 10*3/uL — ABNORMAL HIGH (ref 4.0–10.5)

## 2018-06-29 LAB — COMPREHENSIVE METABOLIC PANEL
ALK PHOS: 52 U/L (ref 38–126)
ALT: 15 U/L (ref 0–44)
AST: 21 U/L (ref 15–41)
Albumin: 4.9 g/dL (ref 3.5–5.0)
Anion gap: 10 (ref 5–15)
BUN: 14 mg/dL (ref 6–20)
CALCIUM: 10 mg/dL (ref 8.9–10.3)
CO2: 26 mmol/L (ref 22–32)
Chloride: 108 mmol/L (ref 98–111)
Creatinine, Ser: 1.17 mg/dL (ref 0.61–1.24)
GFR calc Af Amer: >60 mL/min (ref >60)
GFR calc non Af Amer: >60 mL/min (ref >60)
GLUCOSE: 138 mg/dL — AB (ref 70–99)
Potassium: 3.8 mmol/L (ref 3.5–5.1)
Sodium: 144 mmol/L (ref 135–145)
Total Bilirubin: 2.3 mg/dL — ABNORMAL HIGH (ref 0.3–1.2)
Total Protein: 7.5 g/dL (ref 6.5–8.1)

## 2018-06-29 LAB — URINALYSIS, ROUTINE W REFLEX MICROSCOPIC
BACTERIA UA: NONE SEEN
Bilirubin Urine: NEGATIVE
Glucose, UA: NEGATIVE mg/dL
KETONES UR: 80 mg/dL — AB
Leukocytes, UA: NEGATIVE
Nitrite: NEGATIVE
PROTEIN: 30 mg/dL — AB
Specific Gravity, Urine: 1.024 (ref 1.005–1.030)
pH: 9 — ABNORMAL HIGH (ref 5.0–8.0)

## 2018-06-29 LAB — I-STAT CG4 LACTIC ACID, ED: LACTIC ACID, VENOUS: 1.75 mmol/L (ref 0.5–1.9)

## 2018-06-29 MED ORDER — ONDANSETRON HCL 4 MG PO TABS: 4.0000 mg | ORAL_TABLET | Freq: Three times a day (TID) | ORAL | 0 refills | Status: DC | PRN

## 2018-06-29 MED ORDER — ONDANSETRON HCL 4 MG/2ML IJ SOLN
4.0000 mg | Freq: Once | INTRAMUSCULAR | Status: AC
  Administered 2018-06-29: 4 mg via INTRAVENOUS
  Filled 2018-06-29: qty 2

## 2018-06-29 MED ORDER — HYDROMORPHONE HCL 1 MG/ML IJ SOLN
1.0000 mg | Freq: Once | INTRAMUSCULAR | Status: AC
  Administered 2018-06-29: 1 mg via INTRAVENOUS
  Filled 2018-06-29: qty 1

## 2018-06-29 MED ORDER — SODIUM CHLORIDE 0.9 % IV BOLUS
1000.0000 mL | Freq: Once | INTRAVENOUS | Status: AC
  Administered 2018-06-29: 1000 mL via INTRAVENOUS

## 2018-06-29 MED ORDER — KETOROLAC TROMETHAMINE 15 MG/ML IJ SOLN
15.0000 mg | Freq: Once | INTRAMUSCULAR | Status: AC
  Administered 2018-06-29: 15 mg via INTRAVENOUS
  Filled 2018-06-29: qty 1

## 2018-06-29 NOTE — ED Provider Notes (Signed)
[facility]-EMERGENCY DEPT Provider Note   CSN: [id number] Arrival date & time: 06/29/18  1233     History   Chief Complaint Chief Complaint  Patient presents with  . Abdominal Pain    HPI Joshua Chen is a 22 y.o. male.  HPI   Patient is a 22 year old male who presents the emergency department today for evaluation of left-sided flank pain, nausea and vomiting.  Patient has had left-sided flank pain since being diagnosed with a kidney stone about 2.5 weeks ago.  Patient was seen at the [facility] emergency department and diagnosed with a kidney stone at that time.  Since then has been taking Flomax and was just given an Rx for hydromorphone by his urologist.  States this is morning his pain seemed to worsen and he began to feel nauseated.  States he took hydromorphone and has been unable to keep down.  States pain is constant and severe in nature.  Denies fevers, chills.  Denies diarrhea, constipation.  Endorses dysuria, frequency, urgency and decreased urine output.  No hematuria noted.    His mother is at bedside and states that he is scheduled for an outpatient CT scan next week by his urologist.  Past Medical History:  Diagnosis Date  . Hepatitis C antibody positive in blood   . Mononucleosis     Patient Active Problem List   Diagnosis Date Noted  . Elevated liver function tests 04/18/2018  . Monospot test positive 02/07/2018  . Hepatitis C antibody test positive 02/07/2018  . Screening for HIV (human immunodeficiency virus) 02/07/2018  . Health care maintenance 12/14/2016    History reviewed. No pertinent surgical history.      Home Medications    Prior to Admission medications   Medication Sig Start Date End Date Taking? Authorizing Provider  HYDROmorphone (DILAUDID) 2 MG tablet Take 2 mg by mouth every 4 (four) hours as needed for pain. 06/14/18  Yes [provider]  tamsulosin (FLOMAX) 0.4 MG CAPS capsule Take 0.4 mg by mouth daily.  06/27/18  Yes [provider]  ondansetron (ZOFRAN) 4 MG tablet Take 1 tablet (4 mg total) by mouth every 8 (eight) hours as needed for nausea or vomiting. 06/29/18   Cyncere Sontag S, PA-C    Family History Family History  Problem Relation Age of Onset  . Mental illness Mother   . Hypertension Father   . Mental illness Father   . Diabetes Father   . Hyperlipidemia Father   . Hypertension Maternal Grandfather   . Depression Maternal Grandfather   . Diabetes Maternal Grandfather   . Hyperlipidemia Maternal Grandfather   . Depression Paternal Uncle   . Suicidality Paternal Uncle   . Alcohol abuse Maternal Grandmother   . Diabetes Paternal Grandmother   . Hyperlipidemia Paternal Grandmother   . Diabetes Paternal Grandfather   . Hyperlipidemia Paternal Grandfather   . Hypertension Paternal Grandfather   . Depression Paternal Uncle   . Suicidality Paternal Uncle     Social History Social History   Tobacco Use  . Smoking status: Never Smoker  . Smokeless tobacco: Never Used  Substance Use Topics  . Alcohol use: No    Alcohol/week: 0.0 standard drinks  . Drug use: No     Allergies   Patient has no known allergies.   Review of Systems Review of Systems  Constitutional: Negative for chills and fever.  HENT: Negative for congestion.   Eyes: Negative for visual disturbance.  Respiratory: Negative for cough  and shortness of breath.   Cardiovascular: Negative for chest pain.  Gastrointestinal: Positive for abdominal pain, nausea and vomiting. Negative for blood in stool and diarrhea.  Genitourinary: Positive for dysuria, flank pain, frequency and urgency. Negative for hematuria.  Musculoskeletal: Negative for myalgias.  Skin: Negative for wound.  Neurological: Negative for headaches.   Physical Exam Updated Vital Signs BP 140/90   Pulse 80   Temp 98.1 F (36.7 C) (Oral)   Resp 18   Ht 5\' 11"  (1.803 m)   Wt 82.6 kg   SpO2 100%   BMI 25.38 kg/m    Physical Exam  Constitutional: He appears well-developed and well-nourished.  Non-toxic appearance. He appears distressed.  HENT:  Head: Normocephalic and atraumatic.  Mouth/Throat: Oropharynx is clear and moist.  Eyes: Conjunctivae are normal.  Neck: Neck supple.  Cardiovascular: Normal rate, regular rhythm and normal heart sounds.  No murmur heard. Pulmonary/Chest: Effort normal and breath sounds normal. No stridor. No respiratory distress. He has no wheezes. He has no rales.  Abdominal: Soft. Bowel sounds are decreased. There is tenderness in the left upper quadrant and left lower quadrant. There is CVA tenderness (left). There is no rigidity, no rebound and no guarding.  Musculoskeletal: He exhibits no edema.  Neurological: He is alert.  Skin: Skin is warm and dry. Capillary refill takes less than 2 seconds.  Psychiatric: He has a normal mood and affect.  Nursing note and vitals reviewed.  ED Treatments / Results  Labs (all labs ordered are listed, but only abnormal results are displayed) Labs Reviewed  CBC WITH DIFFERENTIAL/PLATELET - Abnormal; Notable for the following components:      Result Value   WBC 15.4 (*)    Neutro Abs 13.8 (*)    All other components within normal limits  COMPREHENSIVE METABOLIC PANEL - Abnormal; Notable for the following components:   Glucose, Bld 138 (*)    Total Bilirubin 2.3 (*)    All other components within normal limits  URINALYSIS, ROUTINE W REFLEX MICROSCOPIC - Abnormal; Notable for the following components:   pH 9.0 (*)    Hgb urine dipstick SMALL (*)    Ketones, ur 80 (*)    Protein, ur 30 (*)    All other components within normal limits  URINE CULTURE  I-STAT CG4 LACTIC ACID, ED    EKG None  Radiology Ct Renal Stone Study  Result Date: 06/29/2018 CLINICAL DATA:  Left lower quadrant abdominal pain. History of kidney stones. Hematuria. EXAM: CT ABDOMEN AND PELVIS WITHOUT CONTRAST TECHNIQUE: Multidetector CT imaging of the  abdomen and pelvis was performed following the standard protocol without IV contrast. COMPARISON:  None. FINDINGS: Lower chest: Clear lung bases. No significant pleural or pericardial effusion. Hepatobiliary: The liver appears unremarkable as imaged in the noncontrast state. No evidence of gallstones, gallbladder wall thickening or biliary dilatation. Pancreas: Unremarkable. No pancreatic ductal dilatation or surrounding inflammatory changes. Spleen: Normal in size without focal abnormality. Adrenals/Urinary Tract: Both adrenal glands appear normal. There are tiny nonobstructing bilateral renal calculi (2 calculi in each kidney). There is asymmetric perinephric soft tissue stranding on the left and mild dilatation of the left ureter proximal to an obstructing 4 mm calculus just proximal to the ureterovesical junction. This is seen on image 68/2 and is visible on the scout image. It measures up to 7 mm in greatest dimension (coronal image 69/4). The bladder appears unremarkable. Stomach/Bowel: No evidence of bowel wall thickening, distention or surrounding inflammatory change. The appendix appears normal.  Vascular/Lymphatic: There are no enlarged abdominal or pelvic lymph nodes. No significant vascular findings on noncontrast imaging. Reproductive: The prostate gland and seminal vesicles appear normal. Other: No evidence of abdominal wall mass or hernia. No ascites. Musculoskeletal: No acute or significant osseous findings. IMPRESSION: 1. Obstructing calculus in the distal left ureter, measuring up to 7 mm in greatest dimension. 2. Small nonobstructing bilateral renal calculi. 3. No other significant findings. Electronically Signed   By: Carey BullocksWilliam  Veazey M.D.   On: 06/29/2018 13:55    Procedures Procedures (including critical care time)  Medications Ordered in ED Medications  sodium chloride 0.9 % bolus 1,000 mL (0 mLs Intravenous Stopped 06/29/18 1431)  HYDROmorphone (DILAUDID) injection 1 mg (1 mg  Intravenous Given 06/29/18 1353)  ondansetron (ZOFRAN) injection 4 mg (4 mg Intravenous Given 06/29/18 1352)  ketorolac (TORADOL) 15 MG/ML injection 15 mg (15 mg Intravenous Given 06/29/18 1432)  sodium chloride 0.9 % bolus 1,000 mL (0 mLs Intravenous Stopped 06/29/18 1601)     Initial Impression / Assessment and Plan / ED Course  I have reviewed the triage vital signs and the nursing notes.  Pertinent labs & imaging results that were available during my care of the patient were reviewed by me and considered in my medical decision making (see chart for details).     Final Clinical Impressions(s) / ED Diagnoses   Final diagnoses:  Left ureteral stone   Pt presenting with NV, left abd and left flank pt with recently dx stone on CT at OSH. Initially tachycardic here, but otherwise VS WNL. Afebrile. Tachycardia likely secondary to discomfort as HR improved following administration of pain meds, antiemetics, and IVF. Pt nontoxic and nonseptic appearing.  Cbc with leukocytosis to 15. CMP with normal kidney and liver function. Total bili elevated, nonspecific. UA with hematuria, ketones, and protein, but no leukocytes or nitrites to suggest UTI or infected stone. Culture sent.  Lactic acid negative.  CT scan with 7mm obstructing stone to the left distal ureter.   Pain resolved with dilaudid and vomiting improved with zofran. Pt able to tolerate PO in the ED and states he feels back to baseline.  VS improved throughout stay in ED.  Do not suspect infected stone and feel that pt safe for d/c to f/u with his urologist. Return precautions discussed. Pt and his mother at bedside voice an understanding of the plan and reason to return to the ED. All questions answered.  ED Discharge Orders         Ordered    ondansetron (ZOFRAN) 4 MG tablet  Every 8 hours PRN     06/29/18 1611           Karrie MeresCouture, Dosia Yodice S, PA-C 06/29/18 1616    Benjiman CorePickering, Nathan, MD 06/30/18 1556

## 2018-06-29 NOTE — ED Triage Notes (Signed)
Pt complains of left lower quadrant pain. Pt was diagnosed with kidney stone recently. Pt has been taking flomax, has been unable to keep down prescribed hydromorphone.

## 2018-06-29 NOTE — ED Notes (Signed)
PT have water at bedside

## 2018-06-29 NOTE — ED Notes (Signed)
ED Provider at bedside. 

## 2018-06-29 NOTE — Discharge Instructions (Addendum)
Please call your urology office on Monday morning to make an appointment for follow-up.  Please take the antinausea medication as directed.  You may take ibuprofen 400 to 600 mg every 6 hours as needed for your pain.  If you have breakthrough pain that you should take your regularly scheduled pain medication that was given to you by your urologist.  Please continue taking Flomax as directed by your urologist.  Please return to the emergency department for fevers, chills, persistent abdominal pain, persistent vomiting or any new or worsening symptoms.

## 2018-06-30 LAB — URINE CULTURE: Culture: NO GROWTH

## 2018-07-04 DIAGNOSIS — N201 Calculus of ureter: Secondary | ICD-10-CM | POA: Diagnosis not present

## 2018-07-04 DIAGNOSIS — N200 Calculus of kidney: Secondary | ICD-10-CM | POA: Diagnosis not present

## 2018-07-12 DIAGNOSIS — N200 Calculus of kidney: Secondary | ICD-10-CM | POA: Diagnosis not present

## 2018-07-26 DIAGNOSIS — N200 Calculus of kidney: Secondary | ICD-10-CM | POA: Diagnosis not present

## 2018-07-29 DIAGNOSIS — N200 Calculus of kidney: Secondary | ICD-10-CM | POA: Diagnosis not present

## 2018-12-05 ENCOUNTER — Encounter: Payer: Self-pay | Admitting: Adult Health

## 2018-12-05 ENCOUNTER — Ambulatory Visit (INDEPENDENT_AMBULATORY_CARE_PROVIDER_SITE_OTHER): Payer: Federal, State, Local not specified - PPO | Admitting: Adult Health

## 2018-12-05 VITALS — BP 115/73 | HR 64 | Temp 99.4°F | Ht 71.0 in | Wt 178.8 lb

## 2018-12-05 DIAGNOSIS — R197 Diarrhea, unspecified: Secondary | ICD-10-CM | POA: Diagnosis not present

## 2018-12-05 DIAGNOSIS — K582 Mixed irritable bowel syndrome: Secondary | ICD-10-CM | POA: Diagnosis not present

## 2018-12-05 MED ORDER — ONDANSETRON 8 MG PO TBDP: 8.0000 mg | ORAL_TABLET | Freq: Three times a day (TID) | ORAL | 0 refills | Status: DC | PRN

## 2018-12-05 NOTE — Assessment & Plan Note (Signed)
Remain well hydrated and follow "IBS" diet. We will call you with lab results. Please use Ondansetron as needed for nausea. Referral placed to local Gastroenterologist. Please complete IFOBT cards and return to [place]. Please establish with therapist at school. Please call clinic with questions/concerns.

## 2018-12-05 NOTE — Progress Notes (Signed)
Subjective:    Patient ID: Joshua Chen, male    DOB: 07-07-1996, 23 y.o.   MRN: [id number]  HPI:  Joshua Chen presents with alternating constipation and diarrhea since Summer 2019. He went abroad to [facility] August 2019, upon return he had constipation.  A few weeks later he developed sinusitis that was treated with  ABX- unknown which one.  Due to ABX he developed diarrhea and experienced incontinence in public. This incident has caused him to develop anxiety while dining in public which manifest as abdominal bloating. He now reports cycling between periods of constipation and diarrhea.  He reports attempting to have bowel movements several times a day and will only produce "ribbons/strips of stool". He denies hematochezia or hematuria. He does report kidney stones in fall 2019 He estimates to drink between 3-4 16 oz bottles water/day His only exercise is PE "[facility]" class at [facility] He does report sig stress from the following issues- Spring 2019- Mono illness and subsequent dx of Hep C during routine hep screening He recently switched majors from [employer] to [employer] at [employer], which will loss him sig amount of credit hours, prolonging his time in [place] He continues to abstain from tobacco/vape/ETOH use He denies abd pain He reports mild nausea without vomiting He reports no sig decline in appetite, however his weigh is down 5 lbs since last OV in August, current wt 178  Patient Care Team    Relationship Specialty Notifications Start End  Julaine Fusi, NP PCP - General Family Medicine  12/14/16     Patient Active Problem List   Diagnosis Date Noted  . Irritable bowel syndrome with both constipation and diarrhea 12/05/2018  . Elevated liver function tests 04/18/2018  . Monospot test positive 02/07/2018  . Hepatitis C antibody test positive 02/07/2018  . Screening for HIV (human immunodeficiency virus) 02/07/2018  . Health care maintenance  12/14/2016     Past Medical History:  Diagnosis Date  . Hepatitis C antibody positive in blood   . Mononucleosis      History reviewed. No pertinent surgical history.   Family History  Problem Relation Age of Onset  . Mental illness Mother   . Hypertension Father   . Mental illness Father   . Diabetes Father   . Hyperlipidemia Father   . Hypertension Maternal Grandfather   . Depression Maternal Grandfather   . Diabetes Maternal Grandfather   . Hyperlipidemia Maternal Grandfather   . Depression Paternal Uncle   . Suicidality Paternal Uncle   . Alcohol abuse Maternal Grandmother   . Diabetes Paternal Grandmother   . Hyperlipidemia Paternal Grandmother   . Diabetes Paternal Grandfather   . Hyperlipidemia Paternal Grandfather   . Hypertension Paternal Grandfather   . Depression Paternal Uncle   . Suicidality Paternal Uncle      Social History   Substance and Sexual Activity  Drug Use No     Social History   Substance and Sexual Activity  Alcohol Use No  . Alcohol/week: 0.0 standard drinks     Social History   Tobacco Use  Smoking Status Never Smoker  Smokeless Tobacco Never Used     Outpatient Encounter Medications as of 12/05/2018  Medication Sig  . ondansetron (ZOFRAN-ODT) 8 MG disintegrating tablet Take 1 tablet (8 mg total) by mouth every 8 (eight) hours as needed for nausea or vomiting.  . [DISCONTINUED] HYDROmorphone (DILAUDID) 2 MG tablet Take 2 mg by mouth every 4 (four) hours  as needed for pain.  . [DISCONTINUED] ondansetron (ZOFRAN) 4 MG tablet Take 1 tablet (4 mg total) by mouth every 8 (eight) hours as needed for nausea or vomiting.  . [DISCONTINUED] tamsulosin (FLOMAX) 0.4 MG CAPS capsule Take 0.4 mg by mouth daily.   No facility-administered encounter medications on file as of 12/05/2018.     Allergies: Patient has no known allergies.  Body mass index is 24.94 kg/m.  Blood pressure 115/73, pulse 64, temperature 99.4 F (37.4 C),  temperature source Oral, height 5\' 11"  (1.803 m), weight 178 lb 12.8 oz (81.1 kg), SpO2 100 %.   Review of Systems  Constitutional: Positive for fatigue. Negative for activity change, appetite change, chills, diaphoresis, fever and unexpected weight change.  Eyes: Negative for visual disturbance.  Respiratory: Negative for cough, chest tightness, shortness of breath, wheezing and stridor.   Cardiovascular: Negative for chest pain, palpitations and leg swelling.  Gastrointestinal: Positive for constipation, diarrhea and nausea. Negative for abdominal distention, abdominal pain, blood in stool and vomiting.  Genitourinary: Negative for difficulty urinating, flank pain and hematuria.  Musculoskeletal: Negative for back pain.  Neurological: Negative for dizziness and headaches.  Hematological: Does not bruise/bleed easily.  Psychiatric/Behavioral: Negative for agitation, behavioral problems, confusion, decreased concentration, dysphoric mood, hallucinations, self-injury, sleep disturbance and suicidal ideas. The patient is nervous/anxious. The patient is not hyperactive.        Stress +       Objective:   Physical Exam Vitals signs and nursing note reviewed.  Constitutional:      General: He is not in acute distress.    Appearance: Normal appearance. He is normal weight. He is not ill-appearing, toxic-appearing or diaphoretic.  HENT:     Head: Normocephalic and atraumatic.  Cardiovascular:     Rate and Rhythm: Normal rate.     Pulses: Normal pulses.     Heart sounds: Normal heart sounds. No murmur. No friction rub. No gallop.   Pulmonary:     Effort: Pulmonary effort is normal. No respiratory distress.     Breath sounds: Normal breath sounds. No stridor. No wheezing, rhonchi or rales.  Chest:     Chest wall: No tenderness.  Abdominal:     General: Abdomen is flat. Bowel sounds are normal. There is no distension.     Palpations: Abdomen is soft. There is no mass.     Tenderness:  There is no abdominal tenderness. There is no right CVA tenderness, left CVA tenderness, guarding or rebound. Negative signs include Murphy's sign, Rovsing's sign, McBurney's sign, psoas sign and obturator sign.     Hernia: No hernia is present. There is no hernia in the umbilical area or ventral area.     Comments: Heel Tap Sign - Neg  Skin:    General: Skin is warm and dry.     Capillary Refill: Capillary refill takes less than 2 seconds.     Coloration: Skin is not jaundiced.  Neurological:     Mental Status: He is alert.  Psychiatric:        Mood and Affect: Mood normal.        Behavior: Behavior normal.        Thought Content: Thought content normal.        Judgment: Judgment normal.       Assessment & Plan:   1. Diarrhea, unspecified type   2. Irritable bowel syndrome with both constipation and diarrhea     Irritable bowel syndrome with both constipation and diarrhea Remain  well hydrated and follow "IBS" diet. We will call you with lab results. Please use Ondansetron as needed for nausea. Referral placed to local Gastroenterologist. Please complete IFOBT cards and return to [place]. Please establish with therapist at school. Please call clinic with questions/concerns.  Pt was in the office today for 25+ minutes, I spent >75% time  in face to face counseling of patient's various medical conditions and in coordination of care  FOLLOW-UP:  Return if symptoms worsen or fail to improve.

## 2018-12-05 NOTE — Patient Instructions (Addendum)
Irritable Bowel Syndrome, Adult  Irritable bowel syndrome (IBS) is a group of symptoms that affects the organs responsible for digestion (gastrointestinal or GI tract). IBS is not one specific disease. To regulate how the GI tract works, the body sends signals back and forth between the intestines and the brain. If you have IBS, there may be a problem with these signals. As a result, the GI tract does not function normally. The intestines may become more sensitive and overreact to certain things. This may be especially true when you eat certain foods or when you are under stress. There are four types of IBS. These may be determined based on the consistency of your stool (feces):  IBS with diarrhea.  IBS with constipation.  Mixed IBS.  Unsubtyped IBS. It is important to know which type of IBS you have. Certain treatments are more likely to be helpful for certain types of IBS. What are the causes? The exact cause of IBS is not known. What increases the risk? You may have a higher risk for IBS if you:  Are male.  Are younger than 40.  Have a family history of IBS.  Have a mental health condition, such as depression, anxiety, or post-traumatic stress disorder.  Have had a bacterial infection of your GI tract. What are the signs or symptoms? Symptoms of IBS vary from person to person. The main symptom is abdominal pain or discomfort. Other symptoms usually include one or more of the following:  Diarrhea, constipation, or both.  Abdominal swelling or bloating.  Feeling full after eating a small or regular-sized meal.  Frequent gas.  Mucus in the stool.  A feeling of having more stool left after a bowel movement. Symptoms tend to come and go. They may be triggered by stress, mental health conditions, or certain foods. How is this diagnosed? This condition may be diagnosed based on a physical exam, your medical history, and your symptoms. You may have tests, such as:  Blood  tests.  Stool test.  X-rays.  CT scan.  Colonoscopy. This is a procedure in which your GI tract is viewed with a long, thin, flexible tube. How is this treated? There is no cure for IBS, but treatment can help relieve symptoms. Treatment depends on the type of IBS you have, and may include:  Changes to your diet, such as: ? Avoiding foods that cause symptoms. ? Drinking more water. ? Following a low-FODMAP (fermentable oligosaccharides, disaccharides, monosaccharides, and polyols) diet for up to 6 weeks, or as told by your health care provider. FODMAPs are sugars that are hard for some people to digest. ? Eating more fiber. ? Eating medium-sized meals at the same times every day.  Medicines. These may include: ? Fiber supplements, if you have constipation. ? Medicine to control diarrhea (antidiarrheal medicines). ? Medicine to help control muscle tightening (spasms) in your GI tract (antispasmodic medicines). ? Medicines to help with mental health conditions, such as antidepressants or tranquilizers.  Talk therapy or counseling.  Working with a diet and nutrition specialist (dietitian) to help create a food plan that is right for you.  Managing your stress. Follow these instructions at home: Eating and drinking  Eat a healthy diet.  Eat medium-sized meals at about the same time every day. Do not eat large meals.  Gradually eat more fiber-rich foods. These include whole grains, fruits, and vegetables. This may be especially helpful if you have IBS with constipation.  Eat a diet low in FODMAPs.  Drink enough   fluid to keep your urine pale yellow.  Keep a journal of foods that seem to trigger symptoms.  Avoid foods and drinks that: ? Contain added sugar. ? Make your symptoms worse. Dairy products, caffeinated drinks, and carbonated drinks can make symptoms worse for some people. General instructions  Take over-the-counter and prescription medicines and supplements only  as told by your health care provider.  Get enough exercise. Do at least 150 minutes of moderate-intensity exercise each week.  Manage your stress. Getting enough sleep and exercise can help you manage stress.  Keep all follow-up visits as told by your health care provider and therapist. This is important. Alcohol Use  Do not drink alcohol if: ? Your health care provider tells you not to drink. ? You are pregnant, may be pregnant, or are planning to become pregnant.  If you drink alcohol, limit how much you have: ? 0-1 drink a day for women. ? 0-2 drinks a day for men.  Be aware of how much alcohol is in your drink. In the [place] one drink equals one typical bottle of beer (12 oz), one-half glass of wine (5 oz), or one shot of hard liquor (1 oz). Contact a health care provider if you have:  Constant pain.  Weight loss.  Difficulty or pain when swallowing.  Diarrhea that gets worse. Get help right away if you have:  Severe abdominal pain.  Fever.  Diarrhea with symptoms of dehydration, such as dizziness or dry mouth.  Bright red blood in your stool.  Stool that is black and tarry.  Abdominal swelling.  Vomiting that does not stop.  Blood in your vomit. Summary  Irritable bowel syndrome (IBS) is not one specific disease. It is a group of symptoms that affects digestion.  Your intestines may become more sensitive and overreact to certain things. This may be especially true when you eat certain foods or when you are under stress.  There is no cure for IBS, but treatment can help relieve symptoms. This information is not intended to replace advice given to you by your health care provider. Make sure you discuss any questions you have with your health care provider. Document Released: 09/25/2005 Document Revised: 09/18/2017 Document Reviewed: 09/18/2017 Elsevier Interactive Patient Education  2019 Elsevier Inc.   Diet for Irritable Bowel Syndrome When you have  irritable bowel syndrome (IBS), it is very important to eat the foods and follow the eating habits that are best for your condition. IBS may cause various symptoms such as pain in the abdomen, constipation, or diarrhea. Choosing the right foods can help to ease the discomfort from these symptoms. Work with your health care provider and diet and nutrition specialist (dietitian) to find the eating plan that will help to control your symptoms. What are tips for following this plan?      Keep a food diary. This will help you identify foods that cause symptoms. Write down: ? What you eat and when you eat it. ? What symptoms you have. ? When symptoms occur in relation to your meals, such as "pain in abdomen 2 hours after dinner."  Eat your meals slowly and in a relaxed setting.  Aim to eat 5-6 small meals per day. Do not skip meals.  Drink enough fluid to keep your urine pale yellow.  Ask your health care provider if you should take an over-the-counter probiotic to help restore healthy bacteria in your gut (digestive tract). ? Probiotics are foods that contain good bacteria and yeasts.  Your dietitian may have specific dietary recommendations for you based on your symptoms. He or she may recommend that you: ? Avoid foods that cause symptoms. Talk with your dietitian about other ways to get the same nutrients that are in those problem foods. ? Avoid foods with gluten. Gluten is a protein that is found in rye, wheat, and barley. ? Eat more foods that contain soluble fiber. Examples of foods with high soluble fiber include oats, seeds, and certain fruits and vegetables. Take a fiber supplement if directed by your dietitian. ? Reduce or avoid certain foods called FODMAPs. These are foods that contain carbohydrates that are hard to digest. Ask your doctor which foods contain these carbohydrates. What foods are not recommended? The following are some foods and drinks that may make your symptoms  worse:  Fatty foods, such as french fries.  Foods that contain gluten, such as pasta and cereal.  Dairy products, such as milk, cheese, and ice cream.  Chocolate.  Alcohol.  Products with caffeine, such as coffee.  Carbonated drinks, such as soda.  Foods that are high in FODMAPs. These include certain fruits and vegetables.  Products with sweeteners such as honey, high fructose corn syrup, sorbitol, and mannitol. The items listed above may not be a complete list of foods and beverages you should avoid. Contact a dietitian for more information. What foods are good sources of fiber? Your health care provider or dietitian may recommend that you eat more foods that contain fiber. Fiber can help to reduce constipation and other IBS symptoms. Add foods with fiber to your diet a little at a time so your body can get used to them. Too much fiber at one time might cause gas and swelling of your abdomen. The following are some foods that are good sources of fiber:  Berries, such as raspberries, strawberries, and blueberries.  Tomatoes.  Carrots.  Brown rice.  Oats.  Seeds, such as chia and pumpkin seeds. The items listed above may not be a complete list of recommended sources of fiber. Contact your dietitian for more options. Where to find more information  International Foundation for Functional Gastrointestinal Disorders: www.iffgd.[employer] of Diabetes and Digestive and Kidney Diseases: [employer].tn Summary  When you have irritable bowel syndrome (IBS), it is very important to eat the foods and follow the eating habits that are best for your condition.  IBS may cause various symptoms such as pain in the abdomen, constipation, or diarrhea.  Choosing the right foods can help to ease the discomfort that comes from symptoms.  Keep a food diary. This will help you identify foods that cause symptoms.  Your health care provider or diet and nutrition specialist  (dietitian) may recommend that you eat more foods that contain fiber. This information is not intended to replace advice given to you by your health care provider. Make sure you discuss any questions you have with your health care provider. Document Released: 12/16/2003 Document Revised: 04/22/2018 Document Reviewed: 05/29/2017 Elsevier Interactive Patient Education  2019 Elsevier Inc.  Remain well hydrated and follow "IBS" diet. We will call you with lab results. Please use Ondansetron as needed for nausea. Referral placed to local Gastroenterologist. Please complete IFOBT cards and return to [place]. Please establish with therapist at school. Please call clinic with questions/concerns. FEEL BETTER!

## 2018-12-06 LAB — COMPREHENSIVE METABOLIC PANEL
A/G RATIO: 2.3 — AB (ref 1.2–2.2)
ALK PHOS: 58 IU/L (ref 39–117)
ALT: 19 IU/L (ref 0–44)
AST: 16 IU/L (ref 0–40)
Albumin: 5.1 g/dL (ref 4.1–5.2)
BUN/Creatinine Ratio: 14 (ref 9–20)
BUN: 14 mg/dL (ref 6–20)
Bilirubin Total: 1 mg/dL (ref 0.0–1.2)
CALCIUM: 10.3 mg/dL — AB (ref 8.7–10.2)
CO2: 27 mmol/L (ref 20–29)
CREATININE: 1.01 mg/dL (ref 0.76–1.27)
Chloride: 100 mmol/L (ref 96–106)
GFR calc Af Amer: 122 mL/min/{1.73_m2} (ref >59)
GFR, EST NON AFRICAN AMERICAN: 106 mL/min/{1.73_m2} (ref >59)
Globulin, Total: 2.2 g/dL (ref 1.5–4.5)
Glucose: 77 mg/dL (ref 65–99)
POTASSIUM: 4.9 mmol/L (ref 3.5–5.2)
Sodium: 140 mmol/L (ref 134–144)
Total Protein: 7.3 g/dL (ref 6.0–8.5)

## 2018-12-06 LAB — CBC
Hematocrit: 44.7 % (ref 37.5–51.0)
Hemoglobin: 15.7 g/dL (ref 13.0–17.7)
MCH: 30.4 pg (ref 26.6–33.0)
MCHC: 35.1 g/dL (ref 31.5–35.7)
MCV: 87 fL (ref 79–97)
PLATELETS: 297 10*3/uL (ref 150–450)
RBC: 5.16 x10E6/uL (ref 4.14–5.80)
RDW: 12.3 % (ref 11.6–15.4)
WBC: 5.8 10*3/uL (ref 3.4–10.8)

## 2018-12-17 ENCOUNTER — Ambulatory Visit: Payer: Federal, State, Local not specified - PPO | Admitting: Adult Health

## 2018-12-20 ENCOUNTER — Other Ambulatory Visit (INDEPENDENT_AMBULATORY_CARE_PROVIDER_SITE_OTHER): Payer: Federal, State, Local not specified - PPO

## 2018-12-20 ENCOUNTER — Other Ambulatory Visit: Payer: Self-pay

## 2018-12-20 DIAGNOSIS — Z1211 Encounter for screening for malignant neoplasm of colon: Secondary | ICD-10-CM

## 2018-12-20 LAB — POC HEMOCCULT BLD/STL (HOME/3-CARD/SCREEN)
Card #2 Fecal Occult Blod, POC: NEGATIVE
Card #3 Fecal Occult Blood, POC: NEGATIVE
Fecal Occult Blood, POC: NEGATIVE

## 2019-01-09 ENCOUNTER — Encounter (HOSPITAL_COMMUNITY): Payer: Self-pay

## 2019-01-09 ENCOUNTER — Emergency Department (HOSPITAL_COMMUNITY): Payer: Federal, State, Local not specified - PPO

## 2019-01-09 ENCOUNTER — Other Ambulatory Visit: Payer: Self-pay

## 2019-01-09 ENCOUNTER — Emergency Department (HOSPITAL_COMMUNITY)
Admission: EM | Admit: 2019-01-09 | Discharge: 2019-01-09 | Disposition: A | Discharge status: Home / Self Care | Payer: Federal, State, Local not specified - PPO | Attending: Emergency Medicine | Admitting: Emergency Medicine

## 2019-01-09 DIAGNOSIS — R1011 Right upper quadrant pain: Secondary | ICD-10-CM | POA: Diagnosis not present

## 2019-01-09 DIAGNOSIS — N132 Hydronephrosis with renal and ureteral calculous obstruction: Secondary | ICD-10-CM

## 2019-01-09 DIAGNOSIS — N202 Calculus of kidney with calculus of ureter: Secondary | ICD-10-CM | POA: Diagnosis not present

## 2019-01-09 DIAGNOSIS — N201 Calculus of ureter: Secondary | ICD-10-CM | POA: Insufficient documentation

## 2019-01-09 LAB — CBC WITH DIFFERENTIAL/PLATELET
Abs Immature Granulocytes: 0.05 10*3/uL (ref 0.00–0.07)
Basophils Absolute: 0 10*3/uL (ref 0.0–0.1)
Basophils Relative: 0 %
Eosinophils Absolute: 0 10*3/uL (ref 0.0–0.5)
Eosinophils Relative: 0 %
HCT: 48 % (ref 39.0–52.0)
Hemoglobin: 16.3 g/dL (ref 13.0–17.0)
Immature Granulocytes: 0 %
Lymphocytes Relative: 13 %
Lymphs Abs: 1.5 10*3/uL (ref 0.7–4.0)
MCH: 30.1 pg (ref 26.0–34.0)
MCHC: 34 g/dL (ref 30.0–36.0)
MCV: 88.7 fL (ref 80.0–100.0)
Monocytes Absolute: 0.5 10*3/uL (ref 0.1–1.0)
Monocytes Relative: 5 %
Neutro Abs: 9.5 10*3/uL — ABNORMAL HIGH (ref 1.7–7.7)
Neutrophils Relative %: 82 %
Platelets: 285 10*3/uL (ref 150–400)
RBC: 5.41 MIL/uL (ref 4.22–5.81)
RDW: 11.9 % (ref 11.5–15.5)
WBC: 11.6 10*3/uL — ABNORMAL HIGH (ref 4.0–10.5)
nRBC: 0 % (ref 0.0–0.2)

## 2019-01-09 LAB — COMPREHENSIVE METABOLIC PANEL
ALT: 18 U/L (ref 0–44)
AST: 19 U/L (ref 15–41)
Albumin: 4.9 g/dL (ref 3.5–5.0)
Alkaline Phosphatase: 52 U/L (ref 38–126)
Anion gap: 13 (ref 5–15)
BUN: 14 mg/dL (ref 6–20)
CO2: 24 mmol/L (ref 22–32)
Calcium: 9.7 mg/dL (ref 8.9–10.3)
Chloride: 100 mmol/L (ref 98–111)
Creatinine, Ser: 1.01 mg/dL (ref 0.61–1.24)
GFR calc Af Amer: >60 mL/min (ref >60)
GFR calc non Af Amer: >60 mL/min (ref >60)
Glucose, Bld: 138 mg/dL — ABNORMAL HIGH (ref 70–99)
Potassium: 4.5 mmol/L (ref 3.5–5.1)
Sodium: 137 mmol/L (ref 135–145)
Total Bilirubin: 2.3 mg/dL — ABNORMAL HIGH (ref 0.3–1.2)
Total Protein: 7.8 g/dL (ref 6.5–8.1)

## 2019-01-09 LAB — URINALYSIS, ROUTINE W REFLEX MICROSCOPIC
Bilirubin Urine: NEGATIVE
Glucose, UA: NEGATIVE mg/dL
Ketones, ur: 20 mg/dL — AB
Leukocytes,Ua: NEGATIVE
Nitrite: NEGATIVE
Protein, ur: 100 mg/dL — AB
RBC / HPF: >50 RBC/hpf — ABNORMAL HIGH (ref 0–5)
Specific Gravity, Urine: 1.021 (ref 1.005–1.030)
pH: 6 (ref 5.0–8.0)

## 2019-01-09 LAB — LIPASE, BLOOD: Lipase: 21 U/L (ref 11–51)

## 2019-01-09 MED ORDER — HYDROMORPHONE HCL 1 MG/ML IJ SOLN
1.0000 mg | Freq: Once | INTRAMUSCULAR | Status: AC
  Administered 2019-01-09: 1 mg via INTRAVENOUS
  Filled 2019-01-09: qty 1

## 2019-01-09 MED ORDER — ONDANSETRON HCL 4 MG/2ML IJ SOLN
4.0000 mg | Freq: Once | INTRAMUSCULAR | Status: AC
  Administered 2019-01-09: 4 mg via INTRAVENOUS
  Filled 2019-01-09: qty 2

## 2019-01-09 MED ORDER — IBUPROFEN 600 MG PO TABS: 600.0000 mg | ORAL_TABLET | Freq: Four times a day (QID) | ORAL | 0 refills | Status: DC | PRN

## 2019-01-09 MED ORDER — SODIUM CHLORIDE 0.9 % IV BOLUS
1000.0000 mL | Freq: Once | INTRAVENOUS | Status: AC
  Administered 2019-01-09: 07:00:00 1000 mL via INTRAVENOUS

## 2019-01-09 MED ORDER — KETOROLAC TROMETHAMINE 30 MG/ML IJ SOLN
30.0000 mg | Freq: Once | INTRAMUSCULAR | Status: AC
  Administered 2019-01-09: 30 mg via INTRAVENOUS
  Filled 2019-01-09: qty 1

## 2019-01-09 MED ORDER — ONDANSETRON 4 MG PO TBDP: 4.0000 mg | ORAL_TABLET | Freq: Three times a day (TID) | ORAL | 0 refills | Status: DC | PRN

## 2019-01-09 MED ORDER — HYDROCODONE-ACETAMINOPHEN 5-325 MG PO TABS: 1.0000 | ORAL_TABLET | Freq: Four times a day (QID) | ORAL | 0 refills | Status: DC | PRN

## 2019-01-09 NOTE — ED Notes (Signed)
Patient transported to CT 

## 2019-01-09 NOTE — ED Provider Notes (Signed)
[facility]-EMERGENCY DEPT Provider Note   CSN: [id number] Arrival date & time: 01/09/19  0554    History   Chief Complaint Chief Complaint  Patient presents with   Flank Pain    HPI Joshua Chen is a 23 y.o. male presents for evaluation of acute onset, progressively worsening right flank pain beginning yesterday morning.  Pain radiates from right flank down to the right lower quadrant of the abdomen, at times into the right testicle but denies any testicular swelling.  Had multiple episodes of nonbloody nonbilious emesis and persistent nausea.  Denies fevers, chills, chest pain, shortness of breath.  Reports pain feels similar to prior episodes of nephrolithiasis but states this is been worse.  Strong family history of kidney stones.  Has taken 1 tablet of hydromorphone but vomited it back up.  Also notes some hematuria.     The history is provided by the patient.    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 No pertinent surgical history.      Home Medications    Prior to Admission medications   Medication Sig Start Date End Date Taking? Authorizing Provider  HYDROMORPHONE HCL PO Take 1 tablet by mouth as needed (pain). Pt states he took some hydromorphone he had from an old prescription. Unaware of stregnth   Yes [provider]  HYDROcodone-acetaminophen (NORCO/VICODIN) 5-325 MG tablet Take 1 tablet by mouth every 6 (six) hours as needed for severe pain. 01/09/19   Shanin Szymanowski A, PA-C  ibuprofen  (ADVIL,MOTRIN) 600 MG tablet Take 1 tablet (600 mg total) by mouth every 6 (six) hours as needed. 01/09/19   Luevenia Maxin, Marvine Encalade A, PA-C  ondansetron (ZOFRAN ODT) 4 MG disintegrating tablet Take 1 tablet (4 mg total) by mouth every 8 (eight) hours as needed for nausea or vomiting. 01/09/19   Jeanie Sewer, PA-C    Family History Family History  Problem Relation Age of Onset   Mental illness Mother    Hypertension Father    Mental illness Father    Diabetes Father    Hyperlipidemia Father    Hypertension Maternal Grandfather    Depression Maternal Grandfather    Diabetes Maternal Grandfather    Hyperlipidemia Maternal Grandfather    Depression Paternal Uncle    Suicidality Paternal Uncle    Alcohol abuse Maternal Grandmother    Diabetes Paternal Grandmother    Hyperlipidemia Paternal Grandmother    Diabetes Paternal Grandfather    Hyperlipidemia Paternal Grandfather    Hypertension Paternal Grandfather    Depression Paternal Uncle    Suicidality Paternal Uncle     Social History Social History   Tobacco Use   Smoking status: Never Smoker   Smokeless tobacco: Never Used  Substance Use Topics   Alcohol use: No    Alcohol/week: 0.0 standard drinks   Drug use: No     Allergies   Patient has no known allergies.   Review of Systems Review of Systems  Constitutional: Negative for chills and fever.  Respiratory: Negative for shortness of breath.  Cardiovascular: Negative for chest pain.  Gastrointestinal: Positive for abdominal pain, nausea and vomiting.  Genitourinary: Positive for flank pain. Negative for dysuria, frequency and urgency.  All other systems reviewed and are negative.    Physical Exam Updated Vital Signs BP 134/81    Pulse 76    Temp 98.4 F (36.9 C) (Oral)    Resp 16    SpO2 100%   Physical Exam Vitals signs and nursing note reviewed.  Constitutional:      General: He is not in acute distress.    Appearance: He is well-developed.      Comments: Resting on right side, appears uncomfortable, intermittently vomiting  HENT:     Head: Normocephalic and atraumatic.  Eyes:     General:        Right eye: No discharge.        Left eye: No discharge.     Conjunctiva/sclera: Conjunctivae normal.  Neck:     Vascular: No JVD.     Trachea: No tracheal deviation.  Cardiovascular:     Rate and Rhythm: Normal rate and regular rhythm.     Heart sounds: Normal heart sounds.  Pulmonary:     Effort: Pulmonary effort is normal.     Breath sounds: Normal breath sounds.  Abdominal:     General: Abdomen is flat. Bowel sounds are normal. There is no distension.     Palpations: Abdomen is soft.     Tenderness: There is abdominal tenderness in the right upper quadrant. There is right CVA tenderness. There is no guarding or rebound. Negative signs include Murphy's sign, Rovsing's sign and McBurney's sign.  Skin:    General: Skin is warm and dry.     Findings: No erythema.  Neurological:     Mental Status: He is alert.  Psychiatric:        Behavior: Behavior normal.      ED Treatments / Results  Labs (all labs ordered are listed, but only abnormal results are displayed) Labs Reviewed  URINALYSIS, ROUTINE W REFLEX MICROSCOPIC - Abnormal; Notable for the following components:      Result Value   Color, Urine AMBER (*)    APPearance CLOUDY (*)    Hgb urine dipstick LARGE (*)    Ketones, ur 20 (*)    Protein, ur 100 (*)    RBC / HPF >50 (*)    Bacteria, UA RARE (*)    All other components within normal limits  CBC WITH DIFFERENTIAL/PLATELET - Abnormal; Notable for the following components:   WBC 11.6 (*)    Neutro Abs 9.5 (*)    All other components within normal limits  COMPREHENSIVE METABOLIC PANEL - Abnormal; Notable for the following components:   Glucose, Bld 138 (*)    Total Bilirubin 2.3 (*)    All other components within normal limits  LIPASE, BLOOD    EKG None  Radiology Ct Renal Stone Study  Result Date:  01/09/2019 CLINICAL DATA:  Right-sided flank pain for 1 day EXAM: CT ABDOMEN AND PELVIS WITHOUT CONTRAST TECHNIQUE: Multidetector CT imaging of the abdomen and pelvis was performed following the standard protocol without IV contrast. COMPARISON:  06/29/2018 FINDINGS: Lower chest: No acute abnormality. Hepatobiliary: The visualized portion of the liver is within normal limits. The gallbladder is unremarkable. Pancreas: Unremarkable. No pancreatic ductal dilatation or surrounding inflammatory changes. Spleen: Visualized spleen is within normal limits. Adrenals/Urinary Tract: Adrenal glands are unremarkable. Left kidney again demonstrates a few tiny nonobstructing stones. Additionally a 4-5  mm stone is noted within the proximal left ureter without significant obstructive change. The more distal left ureter is within normal limits. The right kidney demonstrates decreased attenuation and some fullness of the collecting system secondary to a proximal ureteral stone which measures approximately 4 mm. The more distal right ureter is within normal limits. The bladder is well distended. Stomach/Bowel: The appendix is within normal limits. No obstructive or inflammatory changes of the large or small bowel are seen. Stomach is decompressed. Vascular/Lymphatic: No significant vascular findings are present. No enlarged abdominal or pelvic lymph nodes. Reproductive: Prostate is unremarkable. Other: No abdominal wall hernia or abnormality. No abdominopelvic ascites. Musculoskeletal: No acute or significant osseous findings. IMPRESSION: 4 mm stone in the proximal right ureter with obstructive change and decreased attenuation of the right kidney. Tiny nonobstructing left renal stones. A 4-5 mm proximal left ureteral stone is noted without obstructive change. No other focal abnormality is noted. Electronically Signed   By: Alcide CleverMark  Lukens M.D.   On: 01/09/2019 07:34    Procedures Procedures (including critical care  time)  Medications Ordered in ED Medications  HYDROmorphone (DILAUDID) injection 1 mg (1 mg Intravenous Given 01/09/19 0635)  ketorolac (TORADOL) 30 MG/ML injection 30 mg (30 mg Intravenous Given 01/09/19 0637)  ondansetron (ZOFRAN) injection 4 mg (4 mg Intravenous Given 01/09/19 0637)  sodium chloride 0.9 % bolus 1,000 mL (0 mLs Intravenous Stopped 01/09/19 0809)     Initial Impression / Assessment and Plan / ED Course  I have reviewed the triage vital signs and the nursing notes.  Pertinent labs & imaging results that were available during my care of the patient were reviewed by me and considered in my medical decision making (see chart for details).        Patient presenting with right flank pain.  Reports it is consistent with his usual kidney stone pains.  He is afebrile, initially hypertensive with resolution on reevaluation.  He appears quite uncomfortable.  Presentation most consistent with ureterolithiasis.  Low suspicion of testicular torsion, appendicitis, obstruction, or bowel perforation.  Lab work reviewed by me significant for leukocytosis, no anemia, no metabolic derangements.  Total bilirubin is somewhat elevated though this appears to be his baseline per chart review.  Remainder of LFTs within normal limits.  Doubt cholecystitis or other acute hepatobiliary pathology.  CT renal stone study shows a 4 mm stone in the proximal right ureter with obstructive changes of the right kidney.  Also shows a tiny nonobstructing left renal stone.  Patient given IV fluids, Toradol, Zofran, Dilaudid in the ED and on reevaluation is resting comfortably in no apparent distress.  He reports he is feeling much better.  He is tolerating p.o. fluids without difficulty.  Serial abdominal examinations remain benign.  Will discharge with Zofran, ibuprofen, hydrocodone for severe breakthrough pain.  [place] controlled substance registry was queried with no inconsistencies.  Recommend follow-up with [place] or our local urology group for reevaluation.  Discussed strict ED return precautions. Pt verbalized understanding of and agreement with plan and is safe for discharge home at this time. No complaints prior to discharge.  Final Clinical Impressions(s) / ED Diagnoses   Final diagnoses:  Ureterolithiasis  Hydronephrosis with urinary obstruction due to ureteral calculus    ED Discharge Orders         Ordered    ondansetron (ZOFRAN ODT) 4 MG disintegrating tablet  Every 8 hours PRN     01/09/19 0850    HYDROcodone-acetaminophen (  NORCO/VICODIN) 5-325 MG tablet  Every 6 hours PRN     01/09/19 0850    ibuprofen (ADVIL,MOTRIN) 600 MG tablet  Every 6 hours PRN     01/09/19 0850           Jeanie Sewer, PA-C 01/09/19 0855    Nira Conn, MD 01/09/19 2324

## 2019-01-09 NOTE — Discharge Instructions (Addendum)
1. Medications: Alternate 600 mg of ibuprofen and [phone] mg of Tylenol every 3 hours as needed for pain. Do not exceed 4000 mg of Tylenol daily.  Take ibuprofen with food to avoid upset stomach issues.  You can take hydrocodone as needed for severe pain if the ibuprofen is not working.  This medication make you drowsy so do not drive, drink alcohol, or operate heavy machinery while taking this medicine.  It can also cause constipation so take a stool softener with it.    Take Zofran as needed for nausea.  Let this medicine dissolve under your tongue and wait around 10-15 minutes before eating or drinking after taking this medication to give it time to work.    Take Flomax as prescribed.  This medicine will open up the ureter (the tube connecting the bladder to the kidney) to allow for easier passage of the kidney stone.  Be aware this medication can cause a drop in your blood pressure so be careful when going from laying or sitting to standing as you may feel lightheaded.  If you feel very fatigued, lightheaded, or have persistently low blood pressures, stop taking this medication entirely.  2. Treatment: rest, drink plenty of fluids.   3. Follow Up: Please followup with urology as soon as possible (preferably this week) for discussion of your diagnoses and further evaluation after today's visit; Please return to the ER for persistent vomiting, high fevers or worsening symptoms

## 2019-01-09 NOTE — ED Triage Notes (Signed)
Pt arrived stating he has a kidney stone that started yesterday morning. Pt has a history of stones, none being surgically removed, states he has been able to urinate. Right sided flank pain at 9/10

## 2019-03-21 ENCOUNTER — Encounter: Payer: Self-pay | Admitting: Adult Health

## 2019-05-17 IMAGING — CT CT RENAL STONE PROTOCOL
2 of 4 series · 16 of 46 positions shown, 18 images · non-contrast
Comparison: None.

CLINICAL DATA: Left lower quadrant abdominal pain. History of
kidney stones. Hematuria.

EXAM:
CT ABDOMEN AND PELVIS WITHOUT CONTRAST
TECHNIQUE: Multidetector CT imaging of the abdomen and pelvis was performed
following the standard protocol without IV contrast.

[Series 2: axial st · axial · 0.72mm/px · z∈[+998,+1388]mm · 13 of 88 slices shown, 15 images]
[im 5/88  soft-tissue]
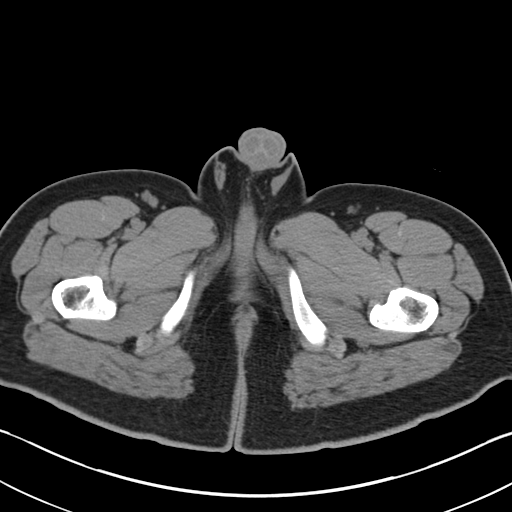
[im 5/88  bone]
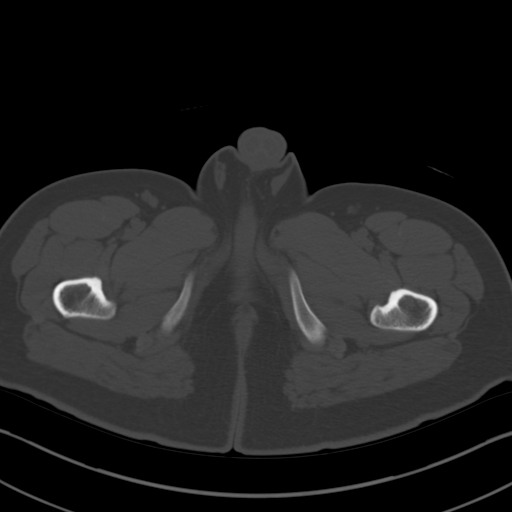
[im 14/88  soft-tissue]
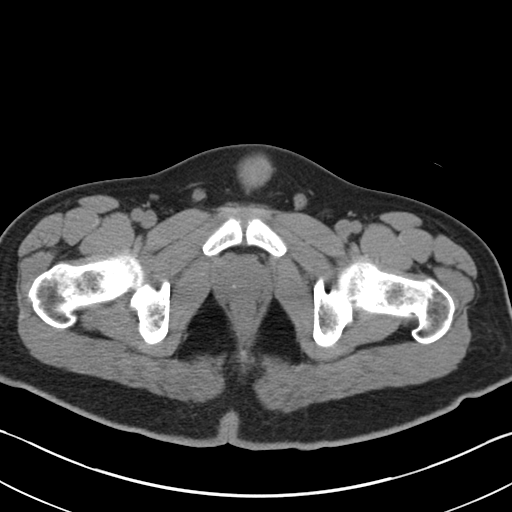
[im 19/88  soft-tissue]
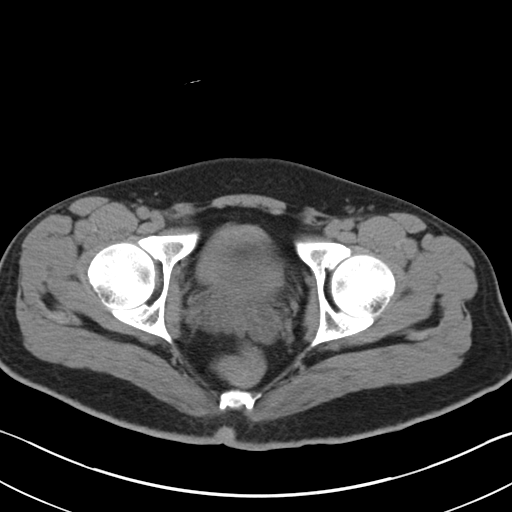
[im 23/88  soft-tissue]
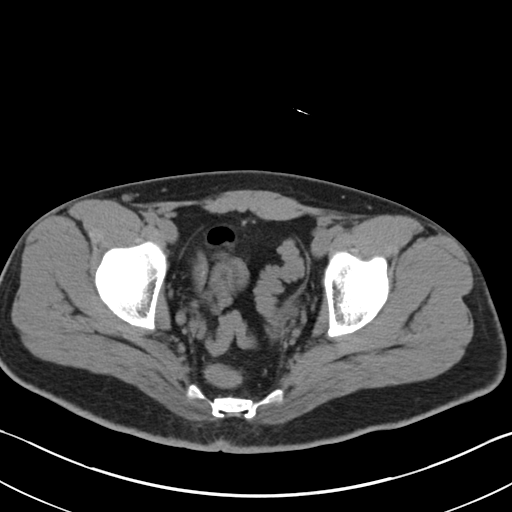
[im 33/88  soft-tissue]
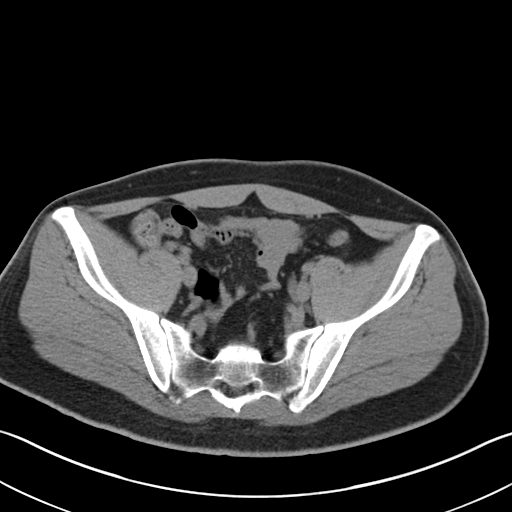
[im 37/88  soft-tissue]
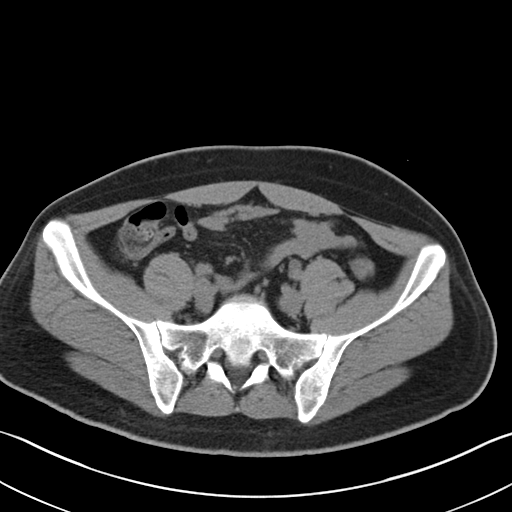
[im 46/88  soft-tissue]
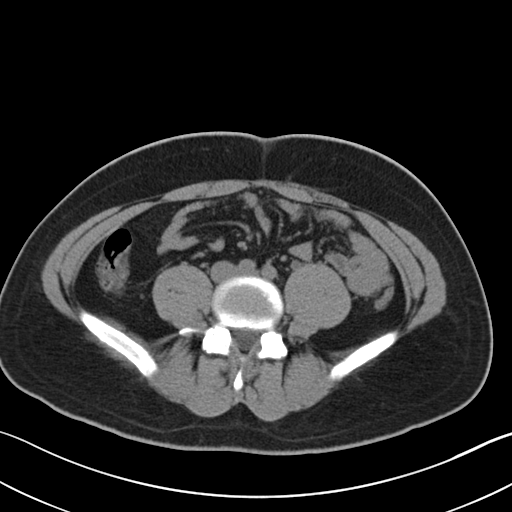
[im 51/88  soft-tissue]
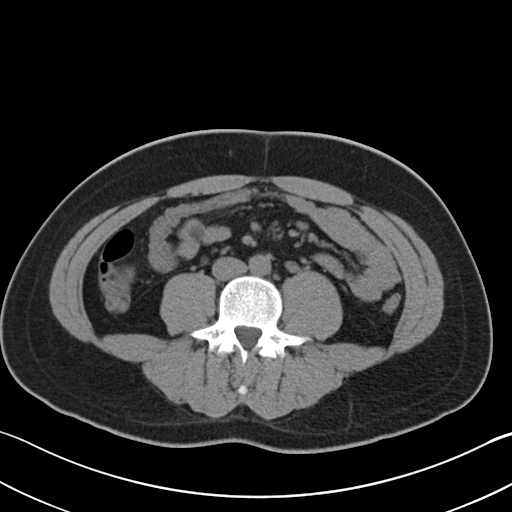
[im 55/88  soft-tissue]
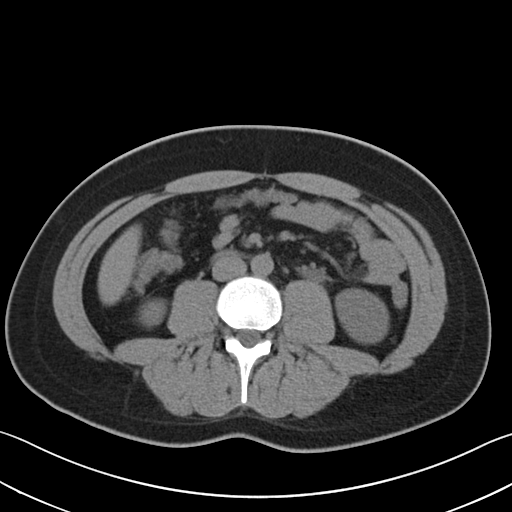
[im 55/88  bone]
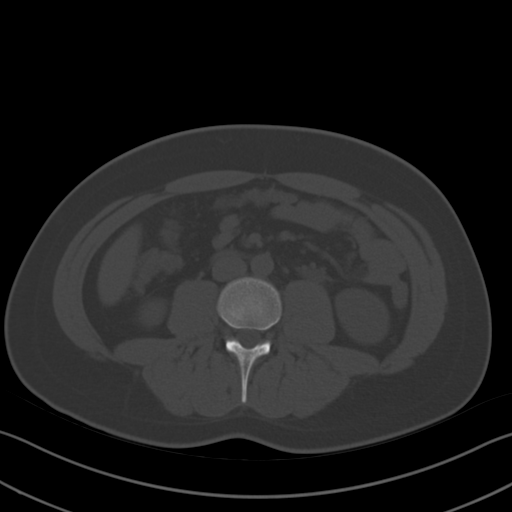
[im 65/88  soft-tissue]
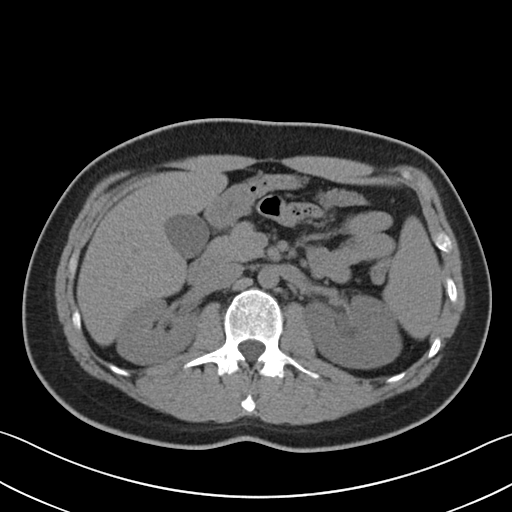
[im 69/88  soft-tissue]
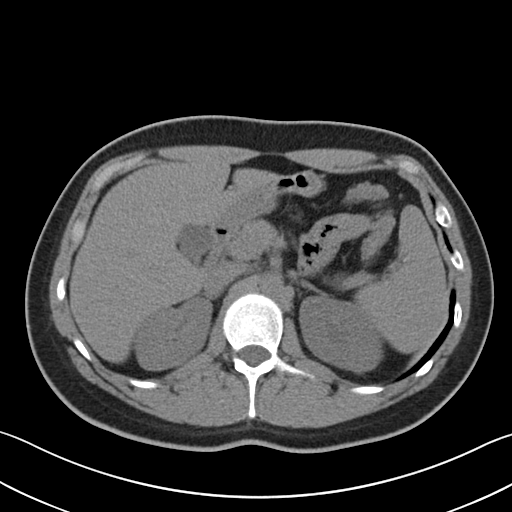
[im 74/88  soft-tissue]
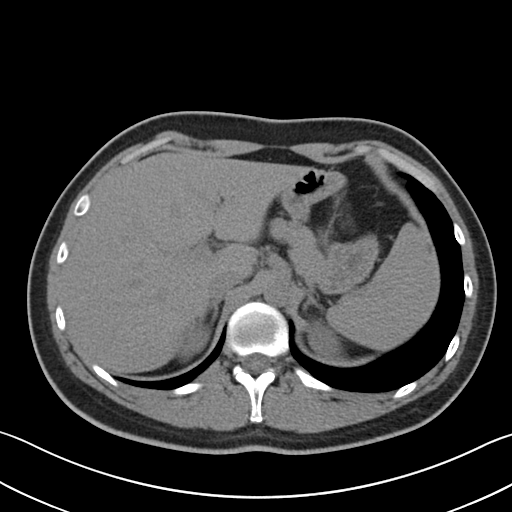
[im 83/88  soft-tissue]
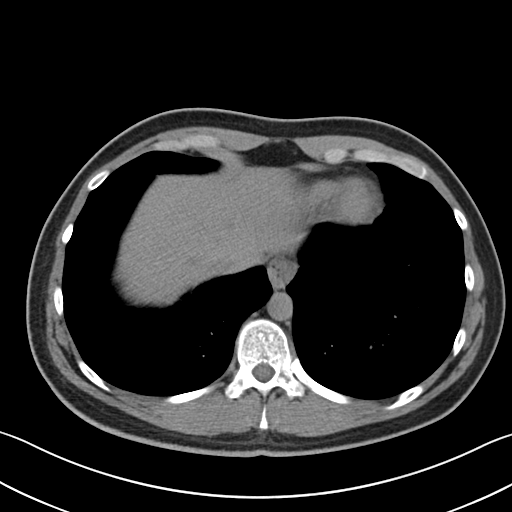

[Series 4: coronal · coronal · 0.73mm/px · 3 of 119 slices shown]
[im 40/119  soft-tissue]
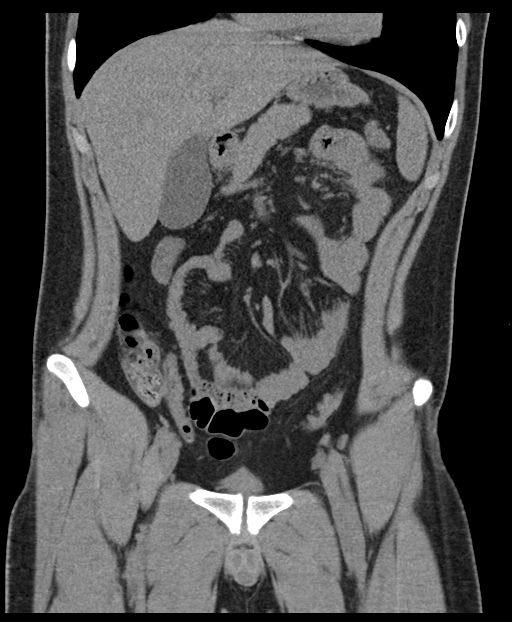
[im 53/119  soft-tissue]
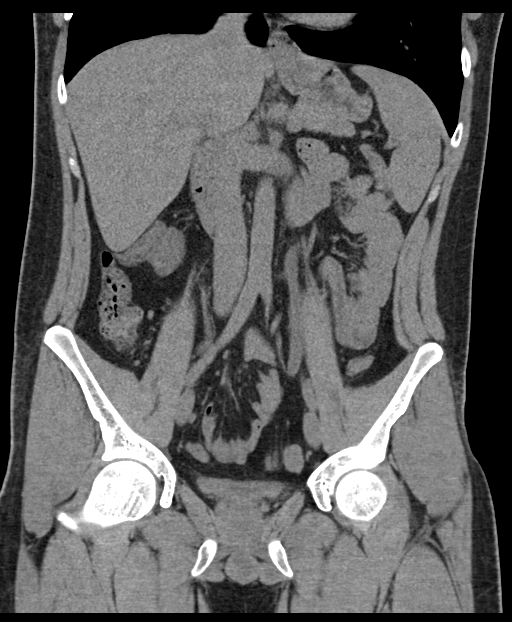
[im 66/119  soft-tissue]
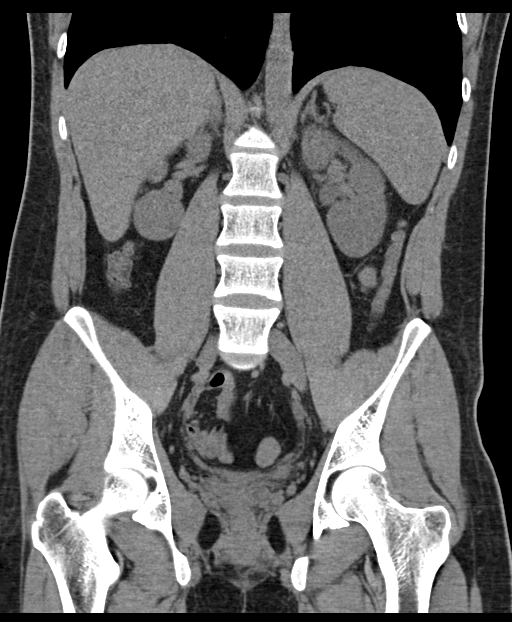

[16 of 46 positions shown; findings below may reference images not displayed]

FINDINGS: Lower chest: Clear lung bases. No significant pleural or pericardial
effusion.

Hepatobiliary: The liver appears unremarkable as imaged in the
noncontrast state. No evidence of gallstones, gallbladder wall
thickening or biliary dilatation.

Pancreas: Unremarkable. No pancreatic ductal dilatation or
surrounding inflammatory changes.

Spleen: Normal in size without focal abnormality.

Adrenals/Urinary Tract: Both adrenal glands appear normal. There are
tiny nonobstructing bilateral renal calculi (2 calculi in each
kidney). There is asymmetric perinephric soft tissue stranding on
the left and mild dilatation of the left ureter proximal to an
obstructing 4 mm calculus just proximal to the ureterovesical
junction. This is seen on image 68/2 and is visible on the scout
image. It measures up to 7 mm in greatest dimension (coronal image
69/4). The bladder appears unremarkable.

Stomach/Bowel: No evidence of bowel wall thickening, distention or
surrounding inflammatory change. The appendix appears normal.

Vascular/Lymphatic: There are no enlarged abdominal or pelvic lymph
nodes. No significant vascular findings on noncontrast imaging.

Reproductive: The prostate gland and seminal vesicles appear normal.

Other: No evidence of abdominal wall mass or hernia. No ascites.

Musculoskeletal: No acute or significant osseous findings.
IMPRESSION: 1. Obstructing calculus in the distal left ureter, measuring up to 7
mm in greatest dimension.
2. Small nonobstructing bilateral renal calculi.
3. No other significant findings.

## 2019-10-14 ENCOUNTER — Ambulatory Visit: Payer: Federal, State, Local not specified - PPO | Attending: Internal Medicine

## 2019-10-14 DIAGNOSIS — Z20822 Contact with and (suspected) exposure to covid-19: Secondary | ICD-10-CM

## 2019-10-17 LAB — NOVEL CORONAVIRUS, NAA: SARS-CoV-2, NAA: NOT DETECTED

## 2020-02-09 DIAGNOSIS — Z23 Encounter for immunization: Secondary | ICD-10-CM | POA: Diagnosis not present

## 2020-03-01 DIAGNOSIS — Z23 Encounter for immunization: Secondary | ICD-10-CM | POA: Diagnosis not present

## 2021-03-08 DIAGNOSIS — K080 Exfoliation of teeth due to systemic causes: Secondary | ICD-10-CM | POA: Diagnosis not present

## 2021-08-25 ENCOUNTER — Other Ambulatory Visit: Payer: Self-pay

## 2021-08-25 ENCOUNTER — Encounter: Payer: Self-pay | Admitting: Physician Assistant

## 2021-08-25 ENCOUNTER — Ambulatory Visit (INDEPENDENT_AMBULATORY_CARE_PROVIDER_SITE_OTHER): Payer: Federal, State, Local not specified - PPO | Admitting: Physician Assistant

## 2021-08-25 VITALS — BP 118/75 | HR 62 | Temp 98.6°F | Ht 71.0 in | Wt 178.8 lb

## 2021-08-25 DIAGNOSIS — Z23 Encounter for immunization: Secondary | ICD-10-CM

## 2021-08-25 DIAGNOSIS — Z0000 Encounter for general adult medical examination without abnormal findings: Secondary | ICD-10-CM

## 2021-08-25 NOTE — Progress Notes (Signed)
Male physical   Impression and Recommendations:    1. Healthcare maintenance   2. Need for influenza vaccination   3. Need for Tdap vaccination      1) Anticipatory Guidance: Skin CA prevention- recommend to use sunscreen when outside along with skin surveillance; eat a balanced and modest diet; physical activity at least 25 minutes per day or minimum of 150 min/ week moderate to intense activity.  2) Immunizations / Screenings / Labs:   All immunizations are up-to-date per recommendations or will be updated today if pt allows.    - Patient understands with dental and vision screens they will schedule independently.  - Will defer obtaining labs. Patient has no concerns or issues. - Patient agreeable to influenza and Tdap vaccine. Pt deferred HPV vaccine.  3) Weight:  Recommend to continue healthy diet habits to improve overall feelings of well being and objective health data. Improve nutrient density of diet through increasing intake of fruits and vegetables and decreasing saturated fats, white flour products and refined sugars.   4) Healthcare Maintenance: -Continue good hydration. -Follow up in 1 year for CPE   No orders of the defined types were placed in this encounter.   No orders of the defined types were placed in this encounter.    Return in about 1 year (around 08/25/2022) for CPE.     Gross side effects, risk and benefits, and alternatives of medications discussed with patient.  Patient is aware that all medications have potential side effects and we are unable to predict every side effect or drug-drug interaction that may occur.  Expresses verbal understanding and consents to current therapy plan and treatment regimen.  Please see AVS handed out to patient at the end of our visit for further patient instructions/ counseling done pertaining to today's office visit.       Subjective:        CC: CPE   HPI: Joshua Chen is a 25 y.o. male who  presents to [facility] today for a yearly health maintenance exam.     Health Maintenance Summary  - Reviewed and updated, unless pt declines services.  Last Cologuard or Colonoscopy:   N/A Family history of Colon CA:  no Tobacco History Reviewed:   yes, never a smoker Alcohol / drug use:    No concerns, no excessive use / no use Exercise Habits:  every other day runs 2 miles Dental Home:  Yes Eye exams: Male history: STD concerns:   none, Additional penile/ urinary concerns: none   Additional concerns beyond Health Maintenance issues:  none    Immunization History  Administered Date(s) Administered   Tdap 10/09/2008     Health Maintenance  Topic Date Due   HPV VACCINES (1 - Male 2-dose series) Never done   TETANUS/TDAP  10/09/2018   INFLUENZA VACCINE  Never done   Hepatitis C Screening  Completed   HIV Screening  Completed   Pneumococcal Vaccine 26-6 Years old  Aged Out       Wt Readings from Last 3 Encounters:  08/25/21 178 lb 12.8 oz (81.1 kg)  12/05/18 178 lb 12.8 oz (81.1 kg)  06/29/18 182 lb (82.6 kg)   BP Readings from Last 3 Encounters:  08/25/21 118/75  01/09/19 134/81  12/05/18 115/73   Pulse Readings from Last 3 Encounters:  08/25/21 62  01/09/19 76  12/05/18 64    Patient Active Problem List   Diagnosis Date Noted  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 No pertinent surgical history.  Family History  Problem Relation Age of Onset   Mental illness Mother    Hypertension Father    Mental illness Father    Diabetes Father     Hyperlipidemia Father    Hypertension Maternal Grandfather    Depression Maternal Grandfather    Diabetes Maternal Grandfather    Hyperlipidemia Maternal Grandfather    Depression Paternal Uncle    Suicidality Paternal Uncle    Alcohol abuse Maternal Grandmother    Diabetes Paternal Grandmother    Hyperlipidemia Paternal Grandmother    Diabetes Paternal Grandfather    Hyperlipidemia Paternal Grandfather    Hypertension Paternal Grandfather    Depression Paternal Uncle    Suicidality Paternal Uncle     Social History   Substance and Sexual Activity  Drug Use No  ,  Social History   Substance and Sexual Activity  Alcohol Use No   Alcohol/week: 0.0 standard drinks  ,  Social History   Tobacco Use  Smoking Status Never  Smokeless Tobacco Never  ,  Social History   Substance and Sexual Activity  Sexual Activity Yes   Birth control/protection: Condom    Patient's Medications  New Prescriptions   No medications on file  Previous Medications   No medications on file  Modified Medications   No medications on file  Discontinued Medications   HYDROCODONE-ACETAMINOPHEN (NORCO/VICODIN) 5-325 MG TABLET    Take 1 tablet by mouth every 6 (six) hours as needed for severe pain.   HYDROMORPHONE HCL PO    Take 1 tablet by mouth as needed (pain). Pt states he took some hydromorphone he had from an old prescription. Unaware of stregnth   IBUPROFEN (ADVIL,MOTRIN) 600 MG TABLET    Take 1 tablet (600 mg total) by mouth every 6 (six) hours as needed.   ONDANSETRON (ZOFRAN ODT) 4 MG DISINTEGRATING TABLET    Take 1 tablet (4 mg total) by mouth every 8 (eight) hours as needed for nausea or vomiting.    Patient has no known allergies.  Review of Systems: General:   Denies fever, chills, unexplained weight loss.  Optho/Auditory:   Denies visual changes, blurred vision/LOV Respiratory:   Denies SOB, DOE more than baseline levels.   Cardiovascular:   Denies chest pain, palpitations, new  onset peripheral edema  Gastrointestinal:   Denies nausea, vomiting, diarrhea.  Genitourinary: Denies dysuria, freq/ urgency, flank pain Endocrine:     Denies hot or cold intolerance, polyuria, polydipsia. Musculoskeletal:   Denies unexplained myalgias, joint swelling, unexplained arthralgias, gait problems.  Skin:  Denies rash, suspicious lesions Neurological:     Denies dizziness, unexplained weakness, numbness  Psychiatric/Behavioral:   Denies mood changes, suicidal or homicidal ideations, hallucinations    Objective:     Blood pressure 118/75, pulse 62, temperature 98.6 F (37 C), height 5\' 11"  (1.803 m), weight 178 lb 12.8 oz (81.1 kg), SpO2 100 %. Body mass index is 24.94 kg/m. General Appearance:    Alert, cooperative, no distress, appears stated age  Head:    Normocephalic, without obvious abnormality, atraumatic  Eyes:    PERRL, conjunctiva/corneas clear, EOM's intact, fundi    benign, both eyes  Ears:    Normal TM's and external ear canals, both ears  Nose:   Nares normal, septum midline, mucosa normal, no drainage    or sinus tenderness  Throat:   Lips w/o lesion, mucosa moist, and tongue normal; teeth and gums normal  Neck:   Supple, symmetrical, trachea midline, no adenopathy;    thyroid:  no enlargement/tenderness/nodules  Back:     Symmetric, no curvature, ROM normal, no CVA tenderness  Lungs:     Clear to auscultation bilaterally, respirations unlabored, no  Wh/ R/ R  Chest Wall:    No tenderness or gross deformity; normal excursion   Heart:    Regular rate and rhythm, S1 and S2 normal, no murmur, rub   or gallop  Abdomen:     Soft, non-tender, bowel sounds active all four quadrants, No G/R/R, no masses, no organomegaly  Genitalia:   Deferred.  Rectal:   Deferred.  Extremities:   Extremities normal, atraumatic, no cyanosis or gross edema  Pulses:   2+ and symmetric all extremities  Skin:   Warm, dry, Skin color, texture, turgor normal, no obvious rashes or  lesions  M-Sk:   Ambulates * 4 w/o difficulty, no gross deformities, tone WNL  Neurologic:   CNII-XII grossly intact Psych:  No HI/SI, judgement and insight good, Euthymic mood. Full Affect.

## 2021-08-25 NOTE — Patient Instructions (Signed)
Preventive Care 21-23 Years Old, Male ?Preventive care refers to lifestyle choices and visits with your health care provider that can promote health and wellness. Preventive care visits are also called wellness exams. ?What can I expect for my preventive care visit? ?Counseling ?During your preventive care visit, your health care provider may ask about your: ?Medical history, including: ?Past medical problems. ?Family medical history. ?Current health, including: ?Emotional well-being. ?Home life and relationship well-being. ?Sexual activity. ?Lifestyle, including: ?Alcohol, nicotine or tobacco, and drug use. ?Access to firearms. ?Diet, exercise, and sleep habits. ?Safety issues such as seatbelt and bike helmet use. ?Sunscreen use. ?Work and work environment. ?Physical exam ?Your health care provider may check your: ?Height and weight. These may be used to calculate your BMI (body mass index). BMI is a measurement that tells if you are at a healthy weight. ?Waist circumference. This measures the distance around your waistline. This measurement also tells if you are at a healthy weight and may help predict your risk of certain diseases, such as type 2 diabetes and high blood pressure. ?Heart rate and blood pressure. ?Body temperature. ?Skin for abnormal spots. ?What immunizations do I need? ?Vaccines are usually given at various ages, according to a schedule. Your health care provider will recommend vaccines for you based on your age, medical history, and lifestyle or other factors, such as travel or where you work. ?What tests do I need? ?Screening ?Your health care provider may recommend screening tests for certain conditions. This may include: ?Lipid and cholesterol levels. ?Diabetes screening. This is done by checking your blood sugar (glucose) after you have not eaten for a while (fasting). ?Hepatitis B test. ?Hepatitis C test. ?HIV (human immunodeficiency virus) test. ?STI (sexually transmitted infection)  testing, if you are at risk. ?Talk with your health care provider about your test results, treatment options, and if necessary, the need for more tests. ?Follow these instructions at home: ?Eating and drinking ? ?Eat a healthy diet that includes fresh fruits and vegetables, whole grains, lean protein, and low-fat dairy products. ?Drink enough fluid to keep your urine pale yellow. ?Take vitamin and mineral supplements as recommended by your health care provider. ?Do not drink alcohol if your health care provider tells you not to drink. ?If you drink alcohol: ?Limit how much you have to 0-2 drinks a day. ?Know how much alcohol is in your drink. In the [place] one drink equals one 12 oz bottle of beer (355 mL), one 5 oz glass of wine (148 mL), or one 1? oz glass of hard liquor (44 mL). ?Lifestyle ?Brush your teeth every morning and night with fluoride toothpaste. Floss one time each day. ?Exercise for at least 30 minutes 5 or more days each week. ?Do not use any products that contain nicotine or tobacco. These products include cigarettes, chewing tobacco, and vaping devices, such as e-cigarettes. If you need help quitting, ask your health care provider. ?Do not use drugs. ?If you are sexually active, practice safe sex. Use a condom or other form of protection to prevent STIs. ?Find healthy ways to manage stress, such as: ?Meditation, yoga, or listening to music. ?Journaling. ?Talking to a trusted person. ?Spending time with friends and family. ?Minimize exposure to UV radiation to reduce your risk of skin cancer. ?Safety ?Always wear your seat belt while driving or riding in a vehicle. ?Do not drive: ?If you have been drinking alcohol. Do not ride with someone who has been drinking. ?If you have been using any mind-altering substances or   drugs. ?While texting. ?When you are tired or distracted. ?Wear a helmet and other protective equipment during sports activities. ?If you have firearms in your house, make sure you  follow all gun safety procedures. ?Seek help if you have been physically or sexually abused. ?What's next? ?Go to your health care provider once a year for an annual wellness visit. ?Ask your health care provider how often you should have your eyes and teeth checked. ?Stay up to date on all vaccines. ?This information is not intended to replace advice given to you by your health care provider. Make sure you discuss any questions you have with your health care provider. ?Document Revised: 03/23/2021 Document Reviewed: 03/23/2021 ?Elsevier Patient Education ? 2022 [employer] ? ?

## 2021-10-05 ENCOUNTER — Ambulatory Visit
Admission: EM | Admit: 2021-10-05 | Discharge: 2021-10-05 | Disposition: A | Discharge status: Home / Self Care | Payer: Federal, State, Local not specified - PPO | Attending: Physician Assistant | Admitting: Physician Assistant

## 2021-10-05 ENCOUNTER — Other Ambulatory Visit: Payer: Self-pay

## 2021-10-05 DIAGNOSIS — H9201 Otalgia, right ear: Secondary | ICD-10-CM | POA: Diagnosis not present

## 2021-10-05 MED ORDER — AMOXICILLIN-POT CLAVULANATE 875-125 MG PO TABS: 1.0000 | ORAL_TABLET | Freq: Two times a day (BID) | ORAL | 0 refills | Status: DC

## 2021-10-05 NOTE — ED Provider Notes (Signed)
[facility]    CSN: [id number] Arrival date & time: 10/05/21  1343      History   Chief Complaint No chief complaint on file.   HPI Joshua Chen is a 25 y.o. male.   Patient here today for evaluation of soreness around his right ear and jaw that started 2 days ago. He reports that pain worsens when he swallows. He has tried ibuprofen without significant relief. He denies any injury to the area. He has not had fever.   The history is provided by the patient.   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 No pertinent surgical history.     Home Medications    Prior to Admission medications   Medication Sig Start Date End Date Taking? Authorizing Provider  amoxicillin-clavulanate (AUGMENTIN) 875-125 MG tablet Take 1 tablet by mouth every 12 (twelve) hours. 10/05/21  Yes Tomi Bamberger, PA-C    Family History Family History  Problem Relation Age of Onset   Mental illness Mother    Hypertension Father    Mental illness Father    Diabetes Father    Hyperlipidemia Father    Hypertension Maternal Grandfather    Depression Maternal Grandfather    Diabetes Maternal Grandfather    Hyperlipidemia Maternal Grandfather    Depression Paternal Uncle    Suicidality Paternal Uncle    Alcohol abuse Maternal Grandmother    Diabetes Paternal Grandmother    Hyperlipidemia Paternal Grandmother    Diabetes Paternal Grandfather    Hyperlipidemia Paternal Grandfather    Hypertension Paternal Grandfather    Depression Paternal Uncle     Suicidality Paternal Uncle     Social History Social History   Tobacco Use   Smoking status: Never   Smokeless tobacco: Never  Substance Use Topics   Alcohol use: No    Alcohol/week: 0.0 standard drinks   Drug use: No     Allergies   Patient has no known allergies.   Review of Systems Review of Systems  Constitutional:  Negative for chills and fever.  HENT:  Positive for ear pain. Negative for congestion and sore throat.   Eyes:  Negative for discharge and redness.  Respiratory:  Negative for shortness of breath.   Skin:  Positive for color change and wound.  Neurological:  Negative for numbness.    Physical Exam Triage Vital Signs ED Triage Vitals  Enc Vitals Group     BP 10/05/21 1410 134/83     Pulse Rate 10/05/21 1410 72     Resp 10/05/21 1410 18     Temp 10/05/21 1410 99.5 F (37.5 C)     Temp Source 10/05/21 1410 Oral     SpO2 10/05/21 1410 98 %     Weight --      Height --      Head Circumference --      Peak Flow --      Pain Score 10/05/21 1412 3     Pain Loc --      Pain Edu? --  Excl. in GC? --    No data found.  Updated Vital Signs BP 134/83 (BP Location: Left Arm)    Pulse 72    Temp 99.5 F (37.5 C) (Oral)    Resp 18    SpO2 98%      Physical Exam Vitals and nursing note reviewed.  Constitutional:      General: He is not in acute distress.    Appearance: Normal appearance. He is not ill-appearing.  HENT:     Head: Normocephalic and atraumatic.     Right Ear: Tympanic membrane normal.     Left Ear: Tympanic membrane normal.     Nose: Nose normal. No congestion or rhinorrhea.     Mouth/Throat:     Mouth: Mucous membranes are moist.     Pharynx: Oropharynx is clear. No oropharyngeal exudate or posterior oropharyngeal erythema.  Eyes:     Conjunctiva/sclera: Conjunctivae normal.  Cardiovascular:     Rate and Rhythm: Normal rate.  Pulmonary:     Effort: Pulmonary effort is normal.  Neurological:     Mental Status: He is alert.   Psychiatric:        Mood and Affect: Mood normal.        Behavior: Behavior normal.        Thought Content: Thought content normal.     UC Treatments / Results  Labs (all labs ordered are listed, but only abnormal results are displayed) Labs Reviewed - No data to display  EKG   Radiology No results found.  Procedures Procedures (including critical care time)  Medications Ordered in UC Medications - No data to display  Initial Impression / Assessment and Plan / UC Course  I have reviewed the triage vital signs and the nursing notes.  Pertinent labs & imaging results that were available during my care of the patient were reviewed by me and considered in my medical decision making (see chart for details).   Unknown etiology of symptoms but patient is concerned for possible infection. We will trial antibiotics and recommended follow up if no improvement or if symptoms worsen in any way.   Final Clinical Impressions(s) / UC Diagnoses   Final diagnoses:  Right ear pain   Discharge Instructions   None    ED Prescriptions     Medication Sig Dispense Auth. Provider   amoxicillin-clavulanate (AUGMENTIN) 875-125 MG tablet Take 1 tablet by mouth every 12 (twelve) hours. 14 tablet Tomi Bamberger, PA-C      PDMP not reviewed this encounter.   Tomi Bamberger, PA-C 10/05/21 1511

## 2021-10-05 NOTE — ED Triage Notes (Signed)
2 day h/o soreness around his right ear and jaw. Pt reports tenderness worsens when she swallows. No meds taken. No injuries.

## 2022-10-10 DIAGNOSIS — R079 Chest pain, unspecified: Secondary | ICD-10-CM | POA: Diagnosis not present

## 2022-10-18 DIAGNOSIS — R079 Chest pain, unspecified: Secondary | ICD-10-CM | POA: Diagnosis not present

## 2022-10-24 DIAGNOSIS — F419 Anxiety disorder, unspecified: Secondary | ICD-10-CM | POA: Diagnosis not present

## 2023-05-24 ENCOUNTER — Ambulatory Visit
Admission: EM | Admit: 2023-05-24 | Discharge: 2023-05-24 | Disposition: A | Discharge status: Home / Self Care | Payer: Federal, State, Local not specified - PPO | Attending: Internal Medicine | Admitting: Internal Medicine

## 2023-05-24 DIAGNOSIS — L600 Ingrowing nail: Secondary | ICD-10-CM | POA: Diagnosis not present

## 2023-05-24 MED ORDER — CEPHALEXIN 500 MG PO CAPS: 500.0000 mg | ORAL_CAPSULE | Freq: Four times a day (QID) | ORAL | 0 refills | Status: AC

## 2023-05-24 NOTE — ED Provider Notes (Signed)
[facility]    CSN: [id number] Arrival date & time: 05/24/23  1159      History   Chief Complaint Chief Complaint  Patient presents with   Toe Pain    HPI TIMOTY SPAUR is a 27 y.o. male.   Patient presents today with right toe pain that started yesterday.  Reports that he has noticed swelling and redness surrounding the right great toenail.  Denies any injury to the area or any associated purulent drainage.  Denies any fever.  He has not taken any medication for pain.   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 No pertinent surgical history.     Home Medications    Prior to Admission medications   Medication Sig Start Date End Date Taking? Authorizing Provider  cephALEXin (KEFLEX) 500 MG capsule Take 1 capsule (500 mg total) by mouth 4 (four) times daily for 5 days. 05/24/23 05/29/23 Yes Gustavus Bryant, FNP    Family History Family History  Problem Relation Age of Onset   Mental illness Mother    Hypertension Father    Mental illness Father    Diabetes Father    Hyperlipidemia Father    Hypertension Maternal Grandfather    Depression Maternal Grandfather    Diabetes Maternal Grandfather    Hyperlipidemia Maternal Grandfather    Depression Paternal Uncle    Suicidality Paternal Uncle    Alcohol abuse Maternal Grandmother    Diabetes Paternal Grandmother    Hyperlipidemia Paternal Grandmother    Diabetes Paternal Grandfather    Hyperlipidemia Paternal Grandfather    Hypertension Paternal  Grandfather    Depression Paternal Uncle    Suicidality Paternal Uncle     Social History Social History   Tobacco Use   Smoking status: Never   Smokeless tobacco: Never  Substance Use Topics   Alcohol use: No    Alcohol/week: 0.0 standard drinks of alcohol   Drug use: No     Allergies   Patient has no known allergies.   Review of Systems Review of Systems Per HPI  Physical Exam Triage Vital Signs ED Triage Vitals  Encounter Vitals Group     BP 05/24/23 1214 129/78     Systolic BP Percentile --      Diastolic BP Percentile --      Pulse Rate 05/24/23 1214 76     Resp 05/24/23 1214 16     Temp 05/24/23 1214 98.6 F (37 C)     Temp Source 05/24/23 1214 Oral     SpO2 05/24/23 1214 97 %     Weight --      Height --      Head Circumference --      Peak Flow --      Pain Score 05/24/23 1215 3     Pain Loc --      Pain Education --      Exclude from Growth Chart --    No data found.  Updated Vital Signs BP 129/78 (BP Location: Left  Arm)   Pulse 76   Temp 98.6 F (37 C) (Oral)   Resp 16   SpO2 97%   Visual Acuity Right Eye Distance:   Left Eye Distance:   Bilateral Distance:    Right Eye Near:   Left Eye Near:    Bilateral Near:     Physical Exam Constitutional:      General: He is not in acute distress.    Appearance: Normal appearance. He is not toxic-appearing or diaphoretic.  HENT:     Head: Normocephalic and atraumatic.  Eyes:     Extraocular Movements: Extraocular movements intact.     Conjunctiva/sclera: Conjunctivae normal.  Pulmonary:     Effort: Pulmonary effort is normal.  Feet:     Comments: Has mild amount of erythema and swelling present to skin on the lateral side of the right great toenail.  Nail is intact with no nail involvement.  No purulent drainage noted.  Capillary refill and pulses intact.  Patient can wiggle toes. Neurological:     General: No focal deficit present.     Mental Status: He is alert and oriented to person,  place, and time. Mental status is at baseline.  Psychiatric:        Mood and Affect: Mood normal.        Behavior: Behavior normal.        Thought Content: Thought content normal.        Judgment: Judgment normal.      UC Treatments / Results  Labs (all labs ordered are listed, but only abnormal results are displayed) Labs Reviewed - No data to display  EKG   Radiology No results found.  Procedures Procedures (including critical care time)  Medications Ordered in UC Medications - No data to display  Initial Impression / Assessment and Plan / UC Course  I have reviewed the triage vital signs and the nursing notes.  Pertinent labs & imaging results that were available during my care of the patient were reviewed by me and considered in my medical decision making (see chart for details).     Differential diagnoses include infected ingrown toenail versus paronychia.  Will treat with cephalexin and patient advised of warm Epsom salt soaks.  Advised strict follow-up with podiatry at provided phone number if symptoms persist or worsen.  Patient verbalized understanding and was agreeable with plan. Final Clinical Impressions(s) / UC Diagnoses   Final diagnoses:  Ingrowing toenail with infection     Discharge Instructions      I have prescribed you an antibiotic given concern of infection around your toenail.  Use warm Epsom salt soaks as we discussed.  Follow-up with podiatry at provided phone number if symptoms persist or worsen.    ED Prescriptions     Medication Sig Dispense Auth. Provider   cephALEXin (KEFLEX) 500 MG capsule Take 1 capsule (500 mg total) by mouth 4 (four) times daily for 5 days. 20 capsule Gustavus Bryant, [place]      PDMP not reviewed this encounter.   Gustavus Bryant, [place] 05/24/23 1256

## 2023-05-24 NOTE — Discharge Instructions (Signed)
I have prescribed you an antibiotic given concern of infection around your toenail.  Use warm Epsom salt soaks as we discussed.  Follow-up with podiatry at provided phone number if symptoms persist or worsen.

## 2023-05-24 NOTE — ED Triage Notes (Signed)
Pt presents with pain and redness to right big toe that started yesterday.

## 2023-05-28 ENCOUNTER — Ambulatory Visit: Payer: Federal, State, Local not specified - PPO | Admitting: Family Medicine

## 2023-07-31 ENCOUNTER — Telehealth: Payer: Self-pay | Admitting: *Deleted

## 2023-07-31 NOTE — Telephone Encounter (Signed)
Returned call from VM that was left on machine, had to LVM to call office back.

## 2023-09-21 ENCOUNTER — Ambulatory Visit (INDEPENDENT_AMBULATORY_CARE_PROVIDER_SITE_OTHER): Payer: Federal, State, Local not specified - PPO | Admitting: Family Medicine

## 2023-09-21 ENCOUNTER — Encounter: Payer: Self-pay | Admitting: Family Medicine

## 2023-09-21 VITALS — BP 128/83 | HR 63 | Resp 20 | Ht 71.0 in | Wt 183.0 lb

## 2023-09-21 DIAGNOSIS — D171 Benign lipomatous neoplasm of skin and subcutaneous tissue of trunk: Secondary | ICD-10-CM | POA: Insufficient documentation

## 2023-09-21 DIAGNOSIS — Z23 Encounter for immunization: Secondary | ICD-10-CM

## 2023-09-21 DIAGNOSIS — Z1329 Encounter for screening for other suspected endocrine disorder: Secondary | ICD-10-CM | POA: Diagnosis not present

## 2023-09-21 DIAGNOSIS — Z131 Encounter for screening for diabetes mellitus: Secondary | ICD-10-CM

## 2023-09-21 DIAGNOSIS — Z0000 Encounter for general adult medical examination without abnormal findings: Secondary | ICD-10-CM

## 2023-09-21 DIAGNOSIS — Z136 Encounter for screening for cardiovascular disorders: Secondary | ICD-10-CM

## 2023-09-21 NOTE — Assessment & Plan Note (Signed)
Discussed lipomas are benign and we will continue to monitor.  If lipoma begins to exhibit rapid growth, signs of infection, or becomes bothersome, will change management plans appropriately.

## 2023-09-21 NOTE — Patient Instructions (Addendum)
[facility] Find a Therapist website: you can filter by insurance plans, in-person vs video, concerns addressed, etc.  Things to do to keep yourself healthy! - Exercise at least 30-45 minutes a day, 3-4 days a week.  - Eat a low-fat diet with lots of fruits and vegetables, up to 7-9 servings per day.  - Seatbelts can save your life. Wear them always.  - Smoke detectors on every level of your home, check batteries every year.  - Eye Doctor: have an eye exam every 1-2 years, even if you do not wear glasses or contacts. - Safe sex: if you may be exposed to STDs, use a condom.  - Alcohol: If you drink, do it moderately, less than 2 drinks per day.  - Health Care Power of Attorney: choose someone to speak for you if you are not able.  - Depression and anxiety are common in our stressful world. If you're feeling stressed, down, or like you're losing interest in things you normally enjoy, please come in for a visit.  - Violence: If anyone is threatening or hurting you, please call immediately.  Everyone deserves to be safe and loved in all of their relationships.   I have also included some information about the HPV vaccine series.  Encourage you to read the information and think about it, it is a great way to protect yourself and any current or future partners HPV infections and the potential complications from them.

## 2023-09-21 NOTE — Progress Notes (Signed)
Complete physical exam  Patient: Joshua Chen   DOB: 07-20-1996   27 y.o. Male  MRN: [id number]  Subjective:    Chief Complaint  Patient presents with   Annual Exam    [name] is a 27 y.o. male who presents today for a complete physical exam. He reports consuming a general diet.  He generally feels well. He reports sleeping fairly well. He is interested in starting therapy and would like suggestions for finding 1 for a referral.  He also has a lump on his back that has been present for quite some time that he would like to have looked at.  He does not endorse pain, pruritus, erythema, warmth, rapid growth.   Most recent fall risk assessment:    08/25/2021    2:14 PM  Fall Risk   Falls in the past year? 0  Number falls in past yr: 0  Injury with Fall? 0  Risk for fall due to : No Fall Risks  Follow up Falls evaluation completed     Most recent depression and anxiety screenings:    09/21/2023    9:35 AM 08/25/2021    2:15 PM  PHQ 2/9 Scores  PHQ - 2 Score 2 1  PHQ- 9 Score 5 1      09/21/2023    9:35 AM 08/25/2021    2:15 PM  GAD 7 : Generalized Anxiety Score  Nervous, Anxious, on Edge 1 1  Control/stop worrying 1 0  Worry too much - different things 1 1  Trouble relaxing 1 0  Restless 0 0  Easily annoyed or irritable 0 0  Afraid - awful might happen 1 0  Total GAD 7 Score 5 2  Anxiety Difficulty Somewhat difficult Not difficult at all    Patient Active Problem List   Diagnosis Date Noted   Lipoma of back 09/21/2023   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 No pertinent surgical history. Social History   Tobacco Use   Smoking status: Never    Passive exposure: Never   Smokeless tobacco: Never  Substance Use Topics   Alcohol use: Never   Drug use: Never   Family History  Problem Relation Age of  Onset   Mental illness Mother    Hypertension Father    Mental illness Father    Diabetes Father    Hyperlipidemia Father    Depression Father    Hypertension Maternal Grandfather    Depression Maternal Grandfather    Diabetes Maternal Grandfather    Hyperlipidemia Maternal Grandfather    Depression Paternal Uncle    Suicidality Paternal Uncle    Alcohol abuse Maternal Grandmother    Diabetes Paternal Grandmother    Hyperlipidemia Paternal Grandmother    Diabetes Paternal Grandfather    Hyperlipidemia Paternal Grandfather    Hypertension Paternal Grandfather    Depression Paternal Uncle    Suicidality Paternal Uncle    No Known Allergies   Patient Care Team: Melida Quitter, PA as PCP - General (Family Medicine)   No outpatient medications prior to visit.   No facility-administered medications prior to visit.    Review of Systems  Constitutional:  Negative for chills, fever and malaise/fatigue.  HENT:  Negative for congestion and hearing loss.   Eyes:  Negative for blurred vision and double vision.  Respiratory:  Negative for cough and  shortness of breath.   Cardiovascular:  Negative for chest pain, palpitations and leg swelling.  Gastrointestinal:  Positive for blood in stool and constipation. Negative for abdominal pain, diarrhea and heartburn.  Genitourinary:  Negative for frequency and urgency.  Musculoskeletal:  Negative for myalgias and neck pain.  Neurological:  Negative for headaches.  Endo/Heme/Allergies:  Negative for polydipsia.  Psychiatric/Behavioral:  Negative for depression. The patient has insomnia (Occasional). The patient is not nervous/anxious.       Objective:    BP 128/83 (BP Location: Left Arm, Patient Position: Sitting, Cuff Size: Normal)   Pulse 63   Resp 20   Ht 5\' 11"  (1.803 m)   Wt 183 lb (83 kg)   SpO2 97%   BMI 25.52 kg/m    Physical Exam Constitutional:      General: He is not in acute distress.    Appearance: Normal  appearance.  HENT:     Head: Normocephalic and atraumatic.     Right Ear: Tympanic membrane, ear canal and external ear normal. There is no impacted cerumen.     Left Ear: Tympanic membrane, ear canal and external ear normal. There is no impacted cerumen.     Nose: Nose normal.     Mouth/Throat:     Mouth: Mucous membranes are moist.     Pharynx: Oropharynx is clear. No posterior oropharyngeal erythema.  Eyes:     Extraocular Movements: Extraocular movements intact.     Conjunctiva/sclera: Conjunctivae normal.     Pupils: Pupils are equal, round, and reactive to light.     Comments: Does not wear glasses or contacts  Neck:     Thyroid: No thyroid mass, thyromegaly or thyroid tenderness.  Cardiovascular:     Rate and Rhythm: Normal rate and regular rhythm.     Heart sounds: Normal heart sounds. No murmur heard.    No friction rub. No gallop.  Pulmonary:     Effort: Pulmonary effort is normal. No respiratory distress.     Breath sounds: Normal breath sounds. No stridor. No wheezing or rales.  Abdominal:     General: Bowel sounds are normal.     Palpations: Abdomen is soft. There is no mass.     Tenderness: There is no abdominal tenderness.  Musculoskeletal:        General: Normal range of motion.     Cervical back: Normal range of motion and neck supple.  Lymphadenopathy:     Cervical: No cervical adenopathy.  Skin:    General: Skin is warm and dry.          Comments: 1.5 cm palpable lipoma, no overlying erythema, no warmth  Neurological:     Mental Status: He is alert and oriented to person, place, and time.     Cranial Nerves: No cranial nerve deficit.     Motor: No weakness.     Deep Tendon Reflexes: Reflexes normal.  Psychiatric:        Mood and Affect: Mood normal.        Assessment & Plan:    Routine Health Maintenance and Physical Exam  Immunization History  Administered Date(s) Administered   DTaP 02/01/1998, 04/16/1998, 06/29/1998, 02/21/1999, 06/13/2002    H1N1 09/24/2008   HIB (PRP-OMP) 02/01/1998, 04/16/1998, 02/21/1999   Hepatitis B, PED/ADOLESCENT Jan 19, 1996, 02/01/1998, 11/12/1998   IPV 02/01/1998, 04/16/1998, 02/21/1999, 06/13/2002   Influenza,inj,Quad PF,6+ Mos 08/25/2021   MMR 11/12/1998, 06/13/2002   PFIZER(Purple Top)SARS-COV-2 Vaccination 02/09/2020, 03/01/2020   Pneumococcal Conjugate PCV 7 02/21/1999, 01/02/2000  Rotavirus Pentavalent 02/01/1998, 04/16/1998   Tdap 10/09/2008, 05/20/2009, 08/25/2021   Varicella 11/12/1998    Health Maintenance  Topic Date Due   HPV VACCINES (1 - Male 3-dose series) Never done   INFLUENZA VACCINE  05/10/2023   COVID-19 Vaccine (3 - 2024-25 season) 10/07/2023 (Originally 06/10/2023)   DTaP/Tdap/Td (9 - Td or Tdap) 08/26/2031   Hepatitis C Screening  Completed   HIV Screening  Completed    Will complete screening labs before next annual physical. Agreeable to influenza vaccine today.  Discussed health benefits of physical activity, and encouraged him to engage in regular exercise appropriate for his age and condition.  Wellness examination  Need for influenza vaccination -     Flu vaccine trivalent PF, 6mos and older(Flulaval,Afluria,Fluarix,Fluzone)  Lipoma of back Assessment & Plan: Discussed lipomas are benign and we will continue to monitor.  If lipoma begins to exhibit rapid growth, signs of infection, or becomes bothersome, will change management plans appropriately.   Recommend continuing with high fluid intake and aiming for 30 g of fiber intake each day to reduce constipation and ensure regular bowel movements.  If difficulty with bowel movements to the point of blood on toilet paper continues to be an issue even with these changes, follow-up with PCP.  Provided psychology today website to find a therapist.  If he does need a referral, he will contact PCP to have one sent in.  Return in about 1 year (around 09/20/2024) for annual physical, fasting blood work 1 week before.      Melida Quitter, PA

## 2023-11-09 DIAGNOSIS — Z113 Encounter for screening for infections with a predominantly sexual mode of transmission: Secondary | ICD-10-CM | POA: Diagnosis not present

## 2023-11-09 DIAGNOSIS — J358 Other chronic diseases of tonsils and adenoids: Secondary | ICD-10-CM | POA: Diagnosis not present

## 2023-11-15 ENCOUNTER — Encounter: Payer: Self-pay | Admitting: Family Medicine

## 2024-09-11 ENCOUNTER — Other Ambulatory Visit: Payer: Self-pay

## 2024-09-11 DIAGNOSIS — Z130 Encounter for screening for diseases of the blood and blood-forming organs and certain disorders involving the immune mechanism: Secondary | ICD-10-CM

## 2024-09-15 ENCOUNTER — Other Ambulatory Visit: Payer: Federal, State, Local not specified - PPO

## 2024-09-15 DIAGNOSIS — Z130 Encounter for screening for diseases of the blood and blood-forming organs and certain disorders involving the immune mechanism: Secondary | ICD-10-CM

## 2024-09-22 ENCOUNTER — Encounter: Payer: Federal, State, Local not specified - PPO | Admitting: Family Medicine

## 2024-09-22 ENCOUNTER — Encounter
